# Patient Record
Sex: Female | Born: 1960 | Race: White | Hispanic: No | Marital: Married | Smoking: Never smoker
Health system: Southern US, Community
[De-identification: ages and names within clinical notes are randomized; demographics above are authoritative.]

## PROBLEM LIST (undated history)

## (undated) DIAGNOSIS — N951 Menopausal and female climacteric states: Secondary | ICD-10-CM

## (undated) DIAGNOSIS — M8580 Other specified disorders of bone density and structure, unspecified site: Secondary | ICD-10-CM

## (undated) DIAGNOSIS — R42 Dizziness and giddiness: Secondary | ICD-10-CM

## (undated) DIAGNOSIS — M810 Age-related osteoporosis without current pathological fracture: Secondary | ICD-10-CM

## (undated) DIAGNOSIS — K219 Gastro-esophageal reflux disease without esophagitis: Secondary | ICD-10-CM

## (undated) DIAGNOSIS — E7800 Pure hypercholesterolemia, unspecified: Secondary | ICD-10-CM

## (undated) DIAGNOSIS — C801 Malignant (primary) neoplasm, unspecified: Secondary | ICD-10-CM

## (undated) DIAGNOSIS — L719 Rosacea, unspecified: Secondary | ICD-10-CM

## (undated) DIAGNOSIS — M8008XA Age-related osteoporosis with current pathological fracture, vertebra(e), initial encounter for fracture: Secondary | ICD-10-CM

## (undated) DIAGNOSIS — G4750 Parasomnia, unspecified: Secondary | ICD-10-CM

## (undated) DIAGNOSIS — R7303 Prediabetes: Secondary | ICD-10-CM

## (undated) DIAGNOSIS — E559 Vitamin D deficiency, unspecified: Secondary | ICD-10-CM

## (undated) DIAGNOSIS — I77819 Aortic ectasia, unspecified site: Secondary | ICD-10-CM

## (undated) HISTORY — DX: Gastro-esophageal reflux disease without esophagitis: K21.9

## (undated) HISTORY — DX: Dizziness and giddiness: R42

## (undated) HISTORY — DX: Vitamin D deficiency, unspecified: E55.9

## (undated) HISTORY — DX: Parasomnia, unspecified: G47.50

## (undated) HISTORY — PX: TONSILLECTOMY: SUR1361

## (undated) HISTORY — DX: Age-related osteoporosis with current pathological fracture, vertebra(e), initial encounter for fracture: M80.08XA

## (undated) HISTORY — DX: Rosacea, unspecified: L71.9

## (undated) HISTORY — DX: Menopausal and female climacteric states: N95.1

## (undated) HISTORY — DX: Malignant (primary) neoplasm, unspecified: C80.1

## (undated) HISTORY — PX: HEMORRHOID SURGERY: SHX153

## (undated) HISTORY — DX: Pure hypercholesterolemia, unspecified: E78.00

## (undated) HISTORY — PX: RHINOPLASTY: SUR1284

## (undated) HISTORY — DX: Prediabetes: R73.03

## (undated) HISTORY — DX: Aortic ectasia, unspecified site: I77.819

## (undated) HISTORY — DX: Age-related osteoporosis without current pathological fracture: M81.0

## (undated) HISTORY — PX: KNEE ARTHROSCOPY: SUR90

## (undated) HISTORY — DX: Other specified disorders of bone density and structure, unspecified site: M85.80

---

## 1997-08-17 ENCOUNTER — Other Ambulatory Visit: Admission: RE | Admit: 1997-08-17 | Discharge: 1997-08-17 | Payer: Self-pay | Admitting: Obstetrics and Gynecology

## 1997-10-09 ENCOUNTER — Other Ambulatory Visit: Admission: RE | Admit: 1997-10-09 | Discharge: 1997-10-09 | Payer: Self-pay | Admitting: Obstetrics and Gynecology

## 1997-10-22 ENCOUNTER — Inpatient Hospital Stay (HOSPITAL_COMMUNITY): Admission: AD | Admit: 1997-10-22 | Discharge: 1997-10-22 | Payer: Self-pay | Admitting: Obstetrics and Gynecology

## 1997-10-25 ENCOUNTER — Inpatient Hospital Stay (HOSPITAL_COMMUNITY): Admission: AD | Admit: 1997-10-25 | Discharge: 1997-10-25 | Payer: Self-pay | Admitting: Obstetrics and Gynecology

## 1997-11-01 ENCOUNTER — Inpatient Hospital Stay (HOSPITAL_COMMUNITY): Admission: AD | Admit: 1997-11-01 | Discharge: 1997-11-01 | Payer: Self-pay | Admitting: Obstetrics and Gynecology

## 1998-01-08 ENCOUNTER — Inpatient Hospital Stay (HOSPITAL_COMMUNITY): Admission: AD | Admit: 1998-01-08 | Discharge: 1998-01-08 | Payer: Self-pay | Admitting: Obstetrics and Gynecology

## 1998-01-11 ENCOUNTER — Inpatient Hospital Stay (HOSPITAL_COMMUNITY): Admission: AD | Admit: 1998-01-11 | Discharge: 1998-01-11 | Payer: Self-pay | Admitting: Obstetrics and Gynecology

## 1998-01-18 ENCOUNTER — Inpatient Hospital Stay (HOSPITAL_COMMUNITY): Admission: AD | Admit: 1998-01-18 | Discharge: 1998-01-18 | Payer: Self-pay | Admitting: Obstetrics and Gynecology

## 1998-01-28 ENCOUNTER — Ambulatory Visit (HOSPITAL_COMMUNITY): Admission: RE | Admit: 1998-01-28 | Discharge: 1998-01-28 | Payer: Self-pay | Admitting: Gynecology

## 1998-03-16 ENCOUNTER — Ambulatory Visit (HOSPITAL_COMMUNITY): Admission: RE | Admit: 1998-03-16 | Discharge: 1998-03-16 | Payer: Self-pay | Admitting: Gastroenterology

## 1998-05-05 ENCOUNTER — Inpatient Hospital Stay (HOSPITAL_COMMUNITY): Admission: AD | Admit: 1998-05-05 | Discharge: 1998-05-05 | Payer: Self-pay | Admitting: Gynecology

## 1998-05-06 ENCOUNTER — Inpatient Hospital Stay (HOSPITAL_COMMUNITY): Admission: AD | Admit: 1998-05-06 | Discharge: 1998-05-06 | Payer: Self-pay | Admitting: Obstetrics and Gynecology

## 1998-05-07 ENCOUNTER — Inpatient Hospital Stay (HOSPITAL_COMMUNITY): Admission: AD | Admit: 1998-05-07 | Discharge: 1998-05-07 | Payer: Self-pay | Admitting: Obstetrics and Gynecology

## 1998-05-08 ENCOUNTER — Inpatient Hospital Stay (HOSPITAL_COMMUNITY): Admission: AD | Admit: 1998-05-08 | Discharge: 1998-05-08 | Payer: Self-pay | Admitting: Obstetrics and Gynecology

## 1998-05-09 ENCOUNTER — Inpatient Hospital Stay (HOSPITAL_COMMUNITY): Admission: AD | Admit: 1998-05-09 | Discharge: 1998-05-09 | Payer: Self-pay | Admitting: Obstetrics and Gynecology

## 1998-05-10 ENCOUNTER — Ambulatory Visit (HOSPITAL_COMMUNITY): Admission: RE | Admit: 1998-05-10 | Discharge: 1998-05-10 | Payer: Self-pay | Admitting: Obstetrics and Gynecology

## 1998-09-18 ENCOUNTER — Other Ambulatory Visit: Admission: RE | Admit: 1998-09-18 | Discharge: 1998-09-18 | Payer: Self-pay | Admitting: Obstetrics and Gynecology

## 1998-10-13 ENCOUNTER — Inpatient Hospital Stay (HOSPITAL_COMMUNITY): Admission: AD | Admit: 1998-10-13 | Discharge: 1998-10-13 | Payer: Self-pay | Admitting: Gynecology

## 1998-11-17 ENCOUNTER — Inpatient Hospital Stay (HOSPITAL_COMMUNITY): Admission: AD | Admit: 1998-11-17 | Discharge: 1998-11-17 | Payer: Self-pay | Admitting: Gynecology

## 1998-11-24 ENCOUNTER — Inpatient Hospital Stay (HOSPITAL_COMMUNITY): Admission: AD | Admit: 1998-11-24 | Discharge: 1998-11-24 | Payer: Self-pay | Admitting: Obstetrics and Gynecology

## 1999-11-07 ENCOUNTER — Other Ambulatory Visit: Admission: RE | Admit: 1999-11-07 | Discharge: 1999-11-07 | Payer: Self-pay | Admitting: Obstetrics and Gynecology

## 2000-01-03 ENCOUNTER — Ambulatory Visit (HOSPITAL_COMMUNITY): Admission: AD | Admit: 2000-01-03 | Discharge: 2000-01-03 | Payer: Self-pay | Admitting: Obstetrics and Gynecology

## 2000-11-19 ENCOUNTER — Other Ambulatory Visit: Admission: RE | Admit: 2000-11-19 | Discharge: 2000-11-19 | Payer: Self-pay | Admitting: Obstetrics and Gynecology

## 2001-11-23 ENCOUNTER — Other Ambulatory Visit: Admission: RE | Admit: 2001-11-23 | Discharge: 2001-11-23 | Payer: Self-pay | Admitting: Obstetrics and Gynecology

## 2002-04-28 HISTORY — PX: DILATION AND CURETTAGE OF UTERUS: SHX78

## 2002-07-19 ENCOUNTER — Ambulatory Visit (HOSPITAL_COMMUNITY): Admission: RE | Admit: 2002-07-19 | Discharge: 2002-07-19 | Payer: Self-pay | Admitting: Orthopedic Surgery

## 2002-07-19 ENCOUNTER — Ambulatory Visit (HOSPITAL_COMMUNITY): Admission: RE | Admit: 2002-07-19 | Discharge: 2002-07-19 | Payer: Self-pay | Admitting: Neurology

## 2002-07-20 ENCOUNTER — Encounter: Payer: Self-pay | Admitting: Neurology

## 2002-07-20 ENCOUNTER — Encounter: Payer: Self-pay | Admitting: Orthopedic Surgery

## 2002-12-29 ENCOUNTER — Encounter (INDEPENDENT_AMBULATORY_CARE_PROVIDER_SITE_OTHER): Payer: Self-pay | Admitting: Specialist

## 2002-12-29 ENCOUNTER — Ambulatory Visit (HOSPITAL_BASED_OUTPATIENT_CLINIC_OR_DEPARTMENT_OTHER): Admission: RE | Admit: 2002-12-29 | Discharge: 2002-12-29 | Payer: Self-pay | Admitting: Obstetrics and Gynecology

## 2003-02-01 ENCOUNTER — Other Ambulatory Visit: Admission: RE | Admit: 2003-02-01 | Discharge: 2003-02-01 | Payer: Self-pay | Admitting: Obstetrics and Gynecology

## 2003-10-06 ENCOUNTER — Ambulatory Visit (HOSPITAL_COMMUNITY): Admission: RE | Admit: 2003-10-06 | Discharge: 2003-10-06 | Payer: Self-pay | Admitting: *Deleted

## 2003-10-06 ENCOUNTER — Encounter (INDEPENDENT_AMBULATORY_CARE_PROVIDER_SITE_OTHER): Payer: Self-pay | Admitting: Specialist

## 2004-02-05 ENCOUNTER — Other Ambulatory Visit: Admission: RE | Admit: 2004-02-05 | Discharge: 2004-02-05 | Payer: Self-pay | Admitting: Obstetrics and Gynecology

## 2005-01-02 ENCOUNTER — Encounter: Admission: RE | Admit: 2005-01-02 | Discharge: 2005-01-02 | Payer: Self-pay | Admitting: Family Medicine

## 2005-02-10 ENCOUNTER — Other Ambulatory Visit: Admission: RE | Admit: 2005-02-10 | Discharge: 2005-02-10 | Payer: Self-pay | Admitting: Obstetrics and Gynecology

## 2006-02-13 ENCOUNTER — Other Ambulatory Visit: Admission: RE | Admit: 2006-02-13 | Discharge: 2006-02-13 | Payer: Self-pay | Admitting: Obstetrics and Gynecology

## 2007-02-24 ENCOUNTER — Other Ambulatory Visit: Admission: RE | Admit: 2007-02-24 | Discharge: 2007-02-24 | Payer: Self-pay | Admitting: Obstetrics and Gynecology

## 2007-06-10 ENCOUNTER — Ambulatory Visit (HOSPITAL_COMMUNITY): Admission: RE | Admit: 2007-06-10 | Discharge: 2007-06-10 | Payer: Self-pay | Admitting: Orthopedic Surgery

## 2008-03-28 ENCOUNTER — Other Ambulatory Visit: Admission: RE | Admit: 2008-03-28 | Discharge: 2008-03-28 | Payer: Self-pay | Admitting: Obstetrics and Gynecology

## 2008-03-28 ENCOUNTER — Encounter: Payer: Self-pay | Admitting: Obstetrics and Gynecology

## 2008-03-28 ENCOUNTER — Ambulatory Visit: Payer: Self-pay | Admitting: Obstetrics and Gynecology

## 2008-07-14 ENCOUNTER — Ambulatory Visit: Payer: Self-pay | Admitting: Obstetrics and Gynecology

## 2009-02-28 ENCOUNTER — Ambulatory Visit: Payer: Self-pay | Admitting: Obstetrics and Gynecology

## 2009-03-27 ENCOUNTER — Ambulatory Visit: Payer: Self-pay | Admitting: Obstetrics and Gynecology

## 2009-03-29 ENCOUNTER — Other Ambulatory Visit: Admission: RE | Admit: 2009-03-29 | Discharge: 2009-03-29 | Payer: Self-pay | Admitting: Addiction Medicine

## 2009-03-29 ENCOUNTER — Ambulatory Visit: Payer: Self-pay | Admitting: Obstetrics and Gynecology

## 2010-05-14 ENCOUNTER — Other Ambulatory Visit: Payer: Self-pay | Admitting: Obstetrics and Gynecology

## 2010-05-14 ENCOUNTER — Ambulatory Visit
Admission: RE | Admit: 2010-05-14 | Discharge: 2010-05-14 | Payer: Self-pay | Source: Home / Self Care | Attending: Obstetrics and Gynecology | Admitting: Obstetrics and Gynecology

## 2010-05-14 ENCOUNTER — Other Ambulatory Visit
Admission: RE | Admit: 2010-05-14 | Discharge: 2010-05-14 | Payer: Self-pay | Source: Home / Self Care | Admitting: Obstetrics and Gynecology

## 2010-09-13 NOTE — Op Note (Signed)
   NAME:  Sarah Mclean, Sarah Mclean                          ACCOUNT NO.:  [id number]   MEDICAL RECORD NO.:  [id number]                   PATIENT TYPE:  AMB   LOCATION:  [facility]                                 FACILITY:  [facility]   PHYSICIAN:  Daniel L. Eda Paschal, M.D.           DATE OF BIRTH:  [birth year]   DATE OF PROCEDURE:  12/29/2002  DATE OF DISCHARGE:                                 OPERATIVE REPORT   PREOPERATIVE DIAGNOSIS:  Missed abortion.   POSTOPERATIVE DIAGNOSIS:  Missed abortion.   OPERATION:  Suction curettage.   SURGEON:  Daniel L. Eda Paschal, M.D.   ANESTHESIA:  General.   INDICATIONS:  The patient is a 50 year old female, who had her last normal  menstrual period 2-1/2 weeks ago.  She then started to have heavy bleeding  this week.  Quantitative hCG was elevated at 17,000 in spite of not missing  a period.  Ultrasound was consistent with a collapsed sac which was  consistent with a missed AB.  She is still bleeding.  She now enters the  hospital for [facility] for above.   FINDINGS:  External and vaginal was within normal limits.  Cervix is clean.  Uterus is top-normal size and shape.  Cervix was closed.  Adnexa are  palpably normal.   DESCRIPTION OF PROCEDURE:  After adequate general anesthesia, the patient  was placed in the dorsal lithotomy position, prepped and draped in the usual  sterile manner.  A single-tooth tenaculum was placed in the anterior lip of  the cervix.  The cervix was dilated to a #25 Pratt dilator, and then a  suction curettage was done with a #9 vacuum curette.  Tissue was consistent  with a missed AB.  At the termination of the procedure, there was no  bleeding noted.  The patient tolerated the procedure well and left the  operating room in satisfactory condition.                                               Daniel L. Eda Paschal, M.D.    Tonette Bihari  D:  12/29/2002  T:  12/29/2002  Job:  [id number]

## 2010-09-13 NOTE — H&P (Signed)
[facility]  Patient:    Sarah Mclean, Sarah Mclean                       MRN: [id number] Adm. Date:  12/10/99 Attending:  Gaetano Hawthorne. Lily Peer, M.D.                         History and Physical  CHIEF COMPLAINT:              Incomplete abortion.  HISTORY OF PRESENT ILLNESS:   The patient is a 50 year old gravida 5, para 2, who reported last menstrual period was June 24, and today, she would be approximately 7-1/[redacted] weeks gestation with a due date of March 30.  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 On July 23, her quantitative beta hCG was 67 international units ____, July 26 was 81, July 27 was 96, August 2 was 688.  Her last quantitative beta hCG was 2482 on August 7. DD:  12/09/99 TD:  12/09/99 Job: [id number]

## 2010-09-13 NOTE — Op Note (Signed)
[facility]  Patient:    Sarah Mclean, Sarah Mclean                       MRN: [id number] Proc. Date: 01/03/00 Adm. Date:  [id number] Disc. Date: [id number] Attending:  Sharon Mt                           Operative Report  PREOPERATIVE DIAGNOSIS:  Missed abortion.  POSTOPERATIVE DIAGNOSIS:  Missed abortion.  OPERATION:  Suction curettage.  SURGEON:  Daniel L. Eda Paschal, M.D.  ANESTHESIA:  Paracervical block plus IV sedation.  INDICATIONS:  The patient is a 50 year old gravida 7, para 2, ab 4, who has been followed in my office with an early IUP.  Initially she was followed with serial HCGs and then over the last two to three weeks with serial ultrasounds. They have not shown normal progression, and they have been consistent with a missed ab.  She was offered suction curettage for missed ab but was hopeful to have this proceed in a natural way, and therefore it was done in spite of the diagnosis being made.  However, this morning she came in very heavy vaginal bleeding and cramping and now is ready for intervention.  Ultrasound was redone today to be sure she had not passed all of the tissue since her ultrasound Tuesday confirming a missed ab, and there was still tissue present in the uterus.  FINDINGS:  External and vaginal is within normal limits.  Cervix is clean. Uterus is normal size and shape.  Adnexa are palpably normal.  At the time of suction curettage, there was some significant tissue in the uterus.  The cervix was actually not dilated and had to be dilated.  DESCRIPTION OF PROCEDURE:  The patient was taken to the treatment room, placed in the dorsal lithotomy position and prepped and draped in the usual sterile manner.  She was given IV Versed and Dilaudid.  She was monitored.  She was given a 20 cc 1% plain paracervical block.  Her cervix was dilated to a #25 Pratt dilator, and then a suction curettage was done with removal of  the tissue.  The uterus contracted well.  There was no significant bleeding.  The patient left the treatment room in satisfactory condition. DD:  01/03/00 TD:  01/05/00 Job: [id number]

## 2010-09-13 NOTE — Op Note (Signed)
NAME:  Sarah Mclean, Sarah Mclean NO.:  [id number]   MEDICAL RECORD NO.:  [id number]                   PATIENT TYPE:  AMB   LOCATION:  DAY                                  FACILITY:  [facility]   PHYSICIAN:  Vikki Ports, M.D.         DATE OF BIRTH:  November 06, 1960   DATE OF PROCEDURE:  10/06/2003  DATE OF DISCHARGE:                                 OPERATIVE REPORT   PREOPERATIVE DIAGNOSIS:  Grade III internal hemorrhoids with prolapse.   POSTOPERATIVE DIAGNOSIS:  Grade III internal hemorrhoids with prolapse.   PROCEDURE:  PPH hemorrhoidopexy.   SURGEON:  Vikki Ports, M.D.   ANESTHESIA:  General.   DESCRIPTION OF PROCEDURE:  The patient was taken to the operating room and  placed in the supine position.  After adequate general anesthesia was  induced, the patient was placed in the prone jackknife position.  Perianal  and rectal prep were undertaken.  I first started by injecting 0.5% Marcaine  with 1 mL of Wydase into the three hemorrhoidal bundles.  Anal dilatation  was accomplished up to three digits.  I then placed a 2-0 Prolene suture 5  cm proximal to the dentate line, picking up the mucosa and submucosa only.  This was done circumferentially.  At the completion of this, I injected an  additional 40 mL of Marcaine in the internal and external sphincter muscles.  The stapler was then placed within the rectum.  Pursestring suture was tied  down around it.  The stapler was closed, held for 30 seconds and then fired.  It was removed.  Staple line was intact and was hemostatic.  Inspection of  the hemorrhoidal bundle showed a 2 cm equal ring of hemorrhoidal tissue.  I  see no evidence of muscularis.  The vagina was checked prior to firing the  stapler as well.  Gelfoam packing was placed in the rectum.  The patient  tolerated the procedure well and went to PACU in good condition.                                               Vikki Ports,  M.D.    KRH/MEDQ  D:  10/06/2003  T:  10/06/2003  Job:  [id number]

## 2011-05-15 ENCOUNTER — Encounter: Payer: Self-pay | Admitting: Gynecology

## 2011-05-15 DIAGNOSIS — L719 Rosacea, unspecified: Secondary | ICD-10-CM | POA: Insufficient documentation

## 2011-05-15 DIAGNOSIS — M8580 Other specified disorders of bone density and structure, unspecified site: Secondary | ICD-10-CM | POA: Insufficient documentation

## 2011-05-21 ENCOUNTER — Other Ambulatory Visit: Payer: Self-pay | Admitting: Obstetrics and Gynecology

## 2011-05-21 ENCOUNTER — Encounter (INDEPENDENT_AMBULATORY_CARE_PROVIDER_SITE_OTHER): Payer: BC Managed Care – PPO

## 2011-05-21 DIAGNOSIS — M949 Disorder of cartilage, unspecified: Secondary | ICD-10-CM

## 2011-05-21 DIAGNOSIS — M8580 Other specified disorders of bone density and structure, unspecified site: Secondary | ICD-10-CM

## 2011-06-12 ENCOUNTER — Other Ambulatory Visit (HOSPITAL_COMMUNITY)
Admission: RE | Admit: 2011-06-12 | Discharge: 2011-06-12 | Disposition: A | Discharge status: Home / Self Care | Payer: BC Managed Care – PPO | Source: Ambulatory Visit | Attending: Obstetrics and Gynecology | Admitting: Obstetrics and Gynecology

## 2011-06-12 ENCOUNTER — Encounter: Payer: Self-pay | Admitting: Obstetrics and Gynecology

## 2011-06-12 ENCOUNTER — Ambulatory Visit (INDEPENDENT_AMBULATORY_CARE_PROVIDER_SITE_OTHER): Payer: BC Managed Care – PPO | Admitting: Obstetrics and Gynecology

## 2011-06-12 VITALS — BP 120/64 | Ht 64.0 in | Wt 116.0 lb

## 2011-06-12 DIAGNOSIS — N9089 Other specified noninflammatory disorders of vulva and perineum: Secondary | ICD-10-CM

## 2011-06-12 DIAGNOSIS — Z01419 Encounter for gynecological examination (general) (routine) without abnormal findings: Secondary | ICD-10-CM | POA: Insufficient documentation

## 2011-06-12 DIAGNOSIS — Z833 Family history of diabetes mellitus: Secondary | ICD-10-CM

## 2011-06-12 LAB — CBC WITH DIFFERENTIAL/PLATELET
Basophils Relative: 0 % (ref 0–1)
Eosinophils Absolute: 0.1 10*3/uL (ref 0.0–0.7)
Eosinophils Relative: 1 % (ref 0–5)
HCT: 36.2 % (ref 36.0–46.0)
Hemoglobin: 11.5 g/dL — ABNORMAL LOW (ref 12.0–15.0)
MCH: 28.9 pg (ref 26.0–34.0)
MCHC: 31.8 g/dL (ref 30.0–36.0)
Monocytes Absolute: 0.5 10*3/uL (ref 0.1–1.0)
Monocytes Relative: 10 % (ref 3–12)

## 2011-06-12 LAB — URINALYSIS W MICROSCOPIC + REFLEX CULTURE
Bilirubin Urine: NEGATIVE
Crystals: NONE SEEN
Glucose, UA: NEGATIVE mg/dL
Ketones, ur: NEGATIVE mg/dL
Protein, ur: NEGATIVE mg/dL
RBC / HPF: NONE SEEN RBC/hpf (ref <3)
Specific Gravity, Urine: >1.03 — ABNORMAL HIGH (ref 1.005–1.030)
Squamous Epithelial / LPF: NONE SEEN
WBC, UA: NONE SEEN WBC/hpf (ref <3)

## 2011-06-12 NOTE — Progress Notes (Signed)
Patient came to see me today for her annual GYN exam. She is doing well without any GYN problems. She does not take HRT. She is having no vaginal bleeding. She is having no pelvic pain. She is up-to-date on mammograms. Her last bone density showed worsening osteopenia without an elevated FRAX risk. She takes calcium and vitamin D. She has had no fractures.  Physical examination: Kennon Portela present HEENT within normal limits. Neck: Thyroid not large. No masses. Supraclavicular nodes: not enlarged. Breasts: Examined in both sitting midline position. No skin changes and no masses. Abdomen: Soft no guarding rebound or masses or hernia. Pelvic: External: Within normal limits except on left labia there is a 2 mm raised vascular lesion. BUS: Within normal limits. Vaginal:within normal limits. Good estrogen effect. No evidence of cystocele rectocele or enterocele. Cervix: clean. Uterus: Normal size and shape. Adnexa: No masses. Rectovaginal exam: Confirmatory and negative. Extremities: Within normal limits.  Assessment: Osteopenia. Vulvar lesion.  Plan: Continue yearly mammograms. Continue periodic bone densities. We need to remove this lesion on her vulva. The area was prepped and draped in a sterile manner with Betadine. One percent Xylocaine was used for anesthetic. The lesion was removed with a scalpel. It was sent to pathology for tissue diagnosis. The patient was treated with Monsel solution. She was instructed on postoperative care.

## 2011-09-04 ENCOUNTER — Encounter: Payer: Self-pay | Admitting: Obstetrics and Gynecology

## 2011-09-25 ENCOUNTER — Other Ambulatory Visit: Payer: Self-pay | Admitting: *Deleted

## 2011-09-25 MED ORDER — NONFORMULARY OR COMPOUNDED ITEM: Status: DC

## 2011-09-25 NOTE — Telephone Encounter (Signed)
rx called in

## 2012-06-14 ENCOUNTER — Ambulatory Visit (INDEPENDENT_AMBULATORY_CARE_PROVIDER_SITE_OTHER): Payer: BC Managed Care – PPO | Admitting: Gynecology

## 2012-06-14 ENCOUNTER — Encounter: Payer: Self-pay | Admitting: Gynecology

## 2012-06-14 VITALS — BP 100/68 | Ht 64.0 in | Wt 120.0 lb

## 2012-06-14 DIAGNOSIS — IMO0002 Reserved for concepts with insufficient information to code with codable children: Secondary | ICD-10-CM | POA: Insufficient documentation

## 2012-06-14 DIAGNOSIS — Z803 Family history of malignant neoplasm of breast: Secondary | ICD-10-CM | POA: Insufficient documentation

## 2012-06-14 DIAGNOSIS — Z800 Family history of malignant neoplasm of digestive organs: Secondary | ICD-10-CM | POA: Insufficient documentation

## 2012-06-14 DIAGNOSIS — M8580 Other specified disorders of bone density and structure, unspecified site: Secondary | ICD-10-CM

## 2012-06-14 DIAGNOSIS — N951 Menopausal and female climacteric states: Secondary | ICD-10-CM

## 2012-06-14 DIAGNOSIS — M949 Disorder of cartilage, unspecified: Secondary | ICD-10-CM

## 2012-06-14 DIAGNOSIS — Z8639 Personal history of other endocrine, nutritional and metabolic disease: Secondary | ICD-10-CM | POA: Insufficient documentation

## 2012-06-14 DIAGNOSIS — Z01419 Encounter for gynecological examination (general) (routine) without abnormal findings: Secondary | ICD-10-CM

## 2012-06-14 LAB — CBC WITH DIFFERENTIAL/PLATELET
Basophils Absolute: 0 10*3/uL (ref 0.0–0.1)
Eosinophils Absolute: 0.1 10*3/uL (ref 0.0–0.7)
Eosinophils Relative: 1 % (ref 0–5)
Lymphocytes Relative: 32 % (ref 12–46)
MCV: 87.5 fL (ref 78.0–100.0)
Neutrophils Relative %: 57 % (ref 43–77)
Platelets: 208 10*3/uL (ref 150–400)
RDW: 13.1 % (ref 11.5–15.5)
WBC: 5.5 10*3/uL (ref 4.0–10.5)

## 2012-06-14 LAB — COMPREHENSIVE METABOLIC PANEL
ALT: 14 U/L (ref 0–35)
AST: 19 U/L (ref 0–37)
Chloride: 107 mEq/L (ref 96–112)
Creat: 0.71 mg/dL (ref 0.50–1.10)
Sodium: 137 mEq/L (ref 135–145)
Total Bilirubin: 1.1 mg/dL (ref 0.3–1.2)
Total Protein: 6.5 g/dL (ref 6.0–8.3)

## 2012-06-14 MED ORDER — NONFORMULARY OR COMPOUNDED ITEM: Status: DC

## 2012-06-14 NOTE — Progress Notes (Addendum)
Sarah Mclean 06/14/1960 [id number]   History:    52 y.o.  for annual gyn exam complaining of vaginal dryness and dyspareunia along with occasional vasomotor symptoms. Patient did have an FSH in 2010 which was found to be elevated with a value of 114.My partner in the past had offered her hormone replacement therapy but patient was adamant. Review of her record indicated that in January 2013 her bone density study had demonstrated that she was in the osteopenic range but had significant decreased bone mineralization in the AP spine and left hip. Patient is taking calcium and vitamin D daily and exercising regularly.patient's had 2 prior colonoscopy the last one was for years ago. She has one sister who had breast cancer and was tested for the BRCA1 / BRCA2 gene and was negative. Another sister had colon cancer the age of 12. Review of patient's record also indicated that she has had past history vitamin D deficiency. Patient with no prior history of abnormal Pap smears. Her flu vaccine is up-to-date. She is not sure she's had Tdap vaccine in the past. She has not seen her primary physician for several years.  Past medical history,surgical history, family history and social history were all reviewed and documented in the EPIC chart.  Gynecologic History No LMP recorded. Patient is postmenopausal. Contraception: post menopausal status Last Pap: 2013. Results were: normal Last mammogram: 2013. Results were: normal  Obstetric History OB History   Grav Para Term Preterm Abortions TAB SAB Ect Mult Living   6 2 2  4  4   2      # Outc Date GA Lbr Len/2nd Wgt Sex Del Anes PTL Lv   1 TRM            2 TRM            3 SAB            4 SAB            5 SAB            6 SAB                ROS: A ROS was performed and pertinent positives and negatives are included in the history.  GENERAL: No fevers or chills. HEENT: No change in vision, no earache, sore throat or sinus congestion. NECK: No pain or  stiffness. CARDIOVASCULAR: No chest pain or pressure. No palpitations. PULMONARY: No shortness of breath, cough or wheeze. GASTROINTESTINAL: No abdominal pain, nausea, vomiting or diarrhea, melena or bright red blood per rectum. GENITOURINARY: No urinary frequency, urgency, hesitancy or dysuria. MUSCULOSKELETAL: No joint or muscle pain, no back pain, no recent trauma. DERMATOLOGIC: No rash, no itching, no lesions. ENDOCRINE: No polyuria, polydipsia, no heat or cold intolerance. No recent change in weight. HEMATOLOGICAL: No anemia or easy bruising or bleeding. NEUROLOGIC: No headache, seizures, numbness, tingling or weakness. PSYCHIATRIC: No depression, no loss of interest in normal activity or change in sleep pattern.     Exam: chaperone present  BP 100/68  Ht 5\' 4"  (1.626 m)  Wt 120 lb (54.432 kg)  BMI 20.59 kg/m2  Body mass index is 20.59 kg/(m^2).  General appearance : Well developed well nourished female. No acute distress HEENT: Neck supple, trachea midline, no carotid bruits, no thyroidmegaly Lungs: Clear to auscultation, no rhonchi or wheezes, or rib retractions  Heart: Regular rate and rhythm, no murmurs or gallops Breast:Examined in sitting and supine position were symmetrical in appearance, no palpable  masses or tenderness,  no skin retraction, no nipple inversion, no nipple discharge, no skin discoloration, no axillary or supraclavicular lymphadenopathy Abdomen: no palpable masses or tenderness, no rebound or guarding Extremities: no edema or skin discoloration or tenderness  Pelvic:  Bartholin, Urethra, Skene Glands: Within normal limits             Vagina: No gross lesions or discharge, atrophic changes  Cervix: No gross lesions or discharge  Uterus  anteverted, normal size, shape and consistency, non-tender and mobile  Adnexa  Without masses or tenderness  Anus and perineum  normal   Rectovaginal  normal sphincter tone without palpated masses or tenderness              Hemoccult not done     Assessment/Plan:  52 y.o. female for annual exam who has for many years has been  menopausal. Her vaginal atrophy is  contributing to her dyspareunia and irritation. We had a lengthy discussion of the women's health initiative study as well as different routes of administration of hormone replacement therapy. We also discussed Osphena its risks benefits pros and cons as well. I have explained to her that she would benefit from the low dose vaginal estrogen such as estradiol 0.02% to apply intravaginally twice a week. This would not only help with her vaginal dryness and dyspareunia but the absorption may help with her minimal vasomotor symptoms as well as to help with her osteopenia. She is fully aware that there is still a small risk of breast cancer. We did discuss importance of her doing her monthly self breast examination. She fully understands and accepts and would like to start with the estradiol vaginal estrogen. No Pap smear was done today and the new screening guidelines were discussed. The following labs were ordered: Comprehensive metabolic panel, CBC, urinalysis, TSH, screening cholesterol, urinalysis and vitamin D. She was reminded of the importance of monthly self breast examination and to schedule her mammogram. Literature formation on hormone replacement therapy was provided as well. She will check with her primary physician if her Tdap vaccine is up-to-date.    Ok Edwards MD, 3:38 PM 06/14/2012

## 2012-06-14 NOTE — Patient Instructions (Addendum)
Tetanus, Diphtheria, Pertussis (Tdap) Vaccine What You Need to Know WHY GET VACCINATED? Tetanus, diphtheria and pertussis can be very serious diseases, even for adolescents and adults. Tdap vaccine can protect us from these diseases. TETANUS (Lockjaw) causes painful muscle tightening and stiffness, usually all over the body.  It can lead to tightening of muscles in the head and neck so you can't open your mouth, swallow, or sometimes even breathe. Tetanus kills about 1 out of 5 people who are infected. DIPHTHERIA can cause a thick coating to form in the back of the throat.  It can lead to breathing problems, paralysis, heart failure, and death. PERTUSSIS (Whooping Cough) causes severe coughing spells, which can cause difficulty breathing, vomiting and disturbed sleep.  It can also lead to weight loss, incontinence, and rib fractures. Up to 2 in 100 adolescents and 5 in 100 adults with pertussis are hospitalized or have complications, which could include pneumonia and death. These diseases are caused by bacteria. Diphtheria and pertussis are spread from person to person through coughing or sneezing. Tetanus enters the body through cuts, scratches, or wounds. Before vaccines, the [place] saw as many as 200,000 cases a year of diphtheria and pertussis, and hundreds of cases of tetanus. Since vaccination began, tetanus and diphtheria have dropped by about 99% and pertussis by about 80%. TDAP VACCINE Tdap vaccine can protect adolescents and adults from tetanus, diphtheria, and pertussis. One dose of Tdap is routinely given at age 11 or 12. People who did not get Tdap at that age should get it as soon as possible. Tdap is especially important for health care professionals and anyone having close contact with a baby younger than 12 months. Pregnant women should get a dose of Tdap during every pregnancy, to protect the newborn from pertussis. Infants are most at risk for severe, life-threatening  complications from pertussis. A similar vaccine, called Td, protects from tetanus and diphtheria, but not pertussis. A Td booster should be given every 10 years. Tdap may be given as one of these boosters if you have not already gotten a dose. Tdap may also be given after a severe cut or burn to prevent tetanus infection. Your doctor can give you more information. Tdap may safely be given at the same time as other vaccines. SOME PEOPLE SHOULD NOT GET THIS VACCINE  If you ever had a life-threatening allergic reaction after a dose of any tetanus, diphtheria, or pertussis containing vaccine, OR if you have a severe allergy to any part of this vaccine, you should not get Tdap. Tell your doctor if you have any severe allergies.  If you had a coma, or long or multiple seizures within 7 days after a childhood dose of DTP or DTaP, you should not get Tdap, unless a cause other than the vaccine was found. You can still get Td.  Talk to your doctor if you:  have epilepsy or another nervous system problem,  had severe pain or swelling after any vaccine containing diphtheria, tetanus or pertussis,  ever had Guillain-Barr Syndrome (GBS),  aren't feeling well on the day the shot is scheduled. RISKS OF A VACCINE REACTION With any medicine, including vaccines, there is a chance of side effects. These are usually mild and go away on their own, but serious reactions are also possible. Brief fainting spells can follow a vaccination, leading to injuries from falling. Sitting or lying down for about 15 minutes can help prevent these. Tell your doctor if you feel dizzy or light-headed, or   have vision changes or ringing in the ears. Mild problems following Tdap (Did not interfere with activities)  Pain where the shot was given (about 3 in 4 adolescents or 2 in 3 adults)  Redness or swelling where the shot was given (about 1 person in 5)  Mild fever of at least 100.12F (up to about 1 in 25 adolescents or 1 in  100 adults)  Headache (about 3 or 4 people in 10)  Tiredness (about 1 person in 3 or 4)  Nausea, vomiting, diarrhea, stomach ache (up to 1 in 4 adolescents or 1 in 10 adults)  Chills, body aches, sore joints, rash, swollen glands (uncommon) Moderate problems following Tdap (Interfered with activities, but did not require medical attention)  Pain where the shot was given (about 1 in 5 adolescents or 1 in 100 adults)  Redness or swelling where the shot was given (up to about 1 in 16 adolescents or 1 in 25 adults)  Fever over 102F (about 1 in 100 adolescents or 1 in 250 adults)  Headache (about 3 in 20 adolescents or 1 in 10 adults)  Nausea, vomiting, diarrhea, stomach ache (up to 1 or 3 people in 100)  Swelling of the entire arm where the shot was given (up to about 3 in 100). Severe problems following Tdap (Unable to perform usual activities, required medical attention)  Swelling, severe pain, bleeding and redness in the arm where the shot was given (rare). A severe allergic reaction could occur after any vaccine (estimated less than 1 in a million doses). WHAT IF THERE IS A SERIOUS REACTION? What should I look for?  Look for anything that concerns you, such as signs of a severe allergic reaction, very high fever, or behavior changes. Signs of a severe allergic reaction can include hives, swelling of the face and throat, difficulty breathing, a fast heartbeat, dizziness, and weakness. These would start a few minutes to a few hours after the vaccination. What should I do?  If you think it is a severe allergic reaction or other emergency that can't wait, call 9-1-1 or get the person to the nearest hospital. Otherwise, call your doctor.  Afterward, the reaction should be reported to the "Vaccine Adverse Event Reporting System" (VAERS). Your doctor might file this report, or you can do it yourself through the VAERS web site at www.vaers.LAgents.no, or by calling 1-619-597-9396. VAERS is  only for reporting reactions. They do not give medical advice.  THE NATIONAL VACCINE INJURY COMPENSATION PROGRAM The National Vaccine Injury Compensation Program (VICP) is a federal program that was created to compensate people who may have been injured by certain vaccines. Persons who believe they may have been injured by a vaccine can learn about the program and about filing a claim by calling 1-747-473-0614 or visiting the VICP website at SpiritualWord.at. HOW CAN I LEARN MORE?  Ask your doctor.  Call your local or state health department.  Contact the Centers for Disease Control and Prevention (CDC):  Call 325-812-7722 or visit CDC's website at PicCapture.uy CDC Tdap Vaccine VIS (09/04/11) Document Released: 10/14/2011 Document Reviewed: 10/14/2011 [facility] Patient Information 2013 [place], [place].   Hormone Therapy At menopause, your body begins making less estrogen and progesterone hormones. This causes the body to stop having menstrual periods. This is because estrogen and progesterone hormones control your periods and menstrual cycle. A lack of estrogen may cause symptoms such as:  Hot flushes (or hot flashes).  Vaginal dryness.  Dry skin.  Loss of sex drive.  Risk of bone loss (osteoporosis). When this happens, you may choose to take hormone therapy to get back the estrogen lost during menopause. When the hormone estrogen is given alone, it is usually referred to as ET (Estrogen Therapy). When the hormone progestin is combined with estrogen, it is generally called HT (Hormone Therapy). This was formerly known as hormone replacement therapy (HRT). Your caregiver can help you make a decision on what will be best for you. The decision to use HT seems to change often as new studies are done. Many studies do not agree on the benefits of hormone replacement therapy. LIKELY BENEFITS OF HT INCLUDE PROTECTION FROM:  Hot Flushes (also called hot flashes) - A  hot flush is a sudden feeling of heat that spreads over the face and body. The skin may redden like a blush. It is connected with sweats and sleep disturbance. Women going through menopause may have hot flushes a few times a month or several times per day depending on the woman.  Osteoporosis (bone loss)- Estrogen helps guard against bone loss. After menopause, a woman's bones slowly lose calcium and become weak and brittle. As a result, bones are more likely to break. The hip, wrist, and spine are affected most often. Hormone therapy can help slow bone loss after menopause. Weight bearing exercise and taking calcium with vitamin D also can help prevent bone loss. There are also medications that your caregiver can prescribe that can help prevent osteoporosis.  Vaginal Dryness - Loss of estrogen causes changes in the vagina. Its lining may become thin and dry. These changes can cause pain and bleeding during sexual intercourse. Dryness can also lead to infections. This can cause burning and itching. (Vaginal estrogen treatment can help relieve pain, itching, and dryness.)  Urinary Tract Infections are more common after menopause because of lack of estrogen. Some women also develop urinary incontinence because of low estrogen levels in the vagina and bladder.  Possible other benefits of estrogen include a positive effect on mood and short-term memory in women. RISKS AND COMPLICATIONS  Using estrogen alone without progesterone causes the lining of the uterus to grow. This increases the risk of lining of the uterus (endometrial) cancer. Your caregiver should give another hormone called progestin if you have a uterus.  Women who take combined (estrogen and progestin) HT appear to have an increased risk of breast cancer. The risk appears to be small, but increases throughout the time that HT is taken.  Combined therapy also makes the breast tissue slightly denser which makes it harder to read mammograms  (breast X-rays).  Combined, estrogen and progesterone therapy can be taken together every day, in which case there may be spotting of blood. HT therapy can be taken cyclically in which case you will have menstrual periods. Cyclically means HT is taken for a set amount of days, then not taken, then this process is repeated.  HT may increase the risk of stroke, heart attack, breast cancer and forming blood clots in your leg.  Transdermal estrogen (estrogen that is absorbed through the skin with a patch or a cream) may have more positive results with:  Cholesterol.  Blood pressure.  Blood clots. Having the following conditions may indicate you should not have HT:  Endometrial cancer.  Liver disease.  Breast cancer.  Heart disease.  History of blood clots.  Stroke. TREATMENT   If you choose to take HT and have a uterus, usually estrogen and progestin are prescribed.  Your caregiver will  help you decide the best way to take the medications.  Possible ways to take estrogen include:  Pills.  Patches.  Gels.  Sprays.  Vaginal estrogen cream, rings and tablets.  It is best to take the lowest dose possible that will help your symptoms and take them for the shortest period of time that you can.  Hormone therapy can help relieve some of the problems (symptoms) that affect women at menopause. Before making a decision about HT, talk to your caregiver about what is best for you. Be well informed and comfortable with your decisions. HOME CARE INSTRUCTIONS   Follow your caregivers advice when taking the medications.  A Pap test is done to screen for cervical cancer.  The first Pap test should be done at age 62.  Between ages 74 and 22, Pap tests are repeated every 2 years.  Beginning at age 29, you are advised to have a Pap test every 3 years as long as your past 3 Pap tests have been normal.  Some women have medical problems that increase the chance of getting cervical  cancer. Talk to your caregiver about these problems. It is especially important to talk to your caregiver if a new problem develops soon after your last Pap test. In these cases, your caregiver may recommend more frequent screening and Pap tests.  The above recommendations are the same for women who have or have not gotten the vaccine for HPV (Human Papillomavirus).  If you had a hysterectomy for a problem that was not a cancer or a condition that could lead to cancer, then you no longer need Pap tests. However, even if you no longer need a Pap test, a regular exam is a good idea to make sure no other problems are starting.   If you are between ages 66 and 64, and you have had normal Pap tests going back 10 years, you no longer need Pap tests. However, even if you no longer need a Pap test, a regular exam is a good idea to make sure no other problems are starting.   If you have had past treatment for cervical cancer or a condition that could lead to cancer, you need Pap tests and screening for cancer for at least 20 years after your treatment.  If Pap tests have been discontinued, risk factors (such as a new sexual partner) need to be re-assessed to determine if screening should be resumed.  Some women may need screenings more often if they are at high risk for cervical cancer.  Get mammograms done as per the advice of your caregiver. SEEK IMMEDIATE MEDICAL CARE IF:  You develop abnormal vaginal bleeding.  You have pain or swelling in your legs, shortness of breath, or chest pain.  You develop dizziness or headaches.  You have lumps or changes in your breasts or armpits.  You have slurred speech.  You develop weakness or numbness of your arms or legs.  You have pain, burning, or bleeding when urinating.  You develop abdominal pain. Document Released: 01/11/2003 Document Revised: 07/07/2011 Document Reviewed: 05/01/2010 [facility] Patient Information 2013 [place], [place].

## 2012-06-15 ENCOUNTER — Telehealth: Payer: Self-pay

## 2012-06-15 ENCOUNTER — Encounter: Payer: Self-pay | Admitting: Gynecology

## 2012-06-15 ENCOUNTER — Other Ambulatory Visit: Payer: Self-pay | Admitting: Gynecology

## 2012-06-15 DIAGNOSIS — R799 Abnormal finding of blood chemistry, unspecified: Secondary | ICD-10-CM

## 2012-06-15 LAB — URINALYSIS W MICROSCOPIC + REFLEX CULTURE
Bilirubin Urine: NEGATIVE
Casts: NONE SEEN
Glucose, UA: NEGATIVE mg/dL
Specific Gravity, Urine: 1.021 (ref 1.005–1.030)
Squamous Epithelial / LPF: NONE SEEN

## 2012-06-15 NOTE — Telephone Encounter (Signed)
Left message home and cell phone to call me regarding test result.

## 2012-06-15 NOTE — Telephone Encounter (Signed)
Patient informed. Instructed to be well hydrated. Order put in system. She will call for lab appt.

## 2012-06-15 NOTE — Telephone Encounter (Signed)
Message copied by Keenan Bachelor on Tue Jun 15, 2012 12:32 PM ------      Message from: Aura Camps      Created: Tue Jun 15, 2012 11:08 AM                   ----- Message -----         From: Ok Edwards, MD         Sent: 06/14/2012  11:50 PM           To: Ted Mcalpine            Please inform patient that her BUN (Blood urea nitrogen) was slightly elevated (Kidney function test). I would like for her to come to the office next week well hydrated and I would like to repeat her BUN. She does not need to be fasting. ------

## 2012-06-24 ENCOUNTER — Other Ambulatory Visit: Payer: BC Managed Care – PPO

## 2012-06-24 DIAGNOSIS — R799 Abnormal finding of blood chemistry, unspecified: Secondary | ICD-10-CM

## 2012-10-09 ENCOUNTER — Other Ambulatory Visit: Payer: Self-pay | Admitting: Neurology

## 2013-01-16 ENCOUNTER — Other Ambulatory Visit: Payer: Self-pay | Admitting: Neurology

## 2013-01-17 NOTE — Telephone Encounter (Signed)
Rx signed and faxed.

## 2013-01-27 ENCOUNTER — Encounter: Payer: Self-pay | Admitting: Gynecology

## 2013-02-27 ENCOUNTER — Other Ambulatory Visit: Payer: Self-pay | Admitting: Neurology

## 2013-03-02 ENCOUNTER — Telehealth: Payer: Self-pay | Admitting: Neurology

## 2013-03-03 ENCOUNTER — Other Ambulatory Visit: Payer: Self-pay | Admitting: Neurology

## 2013-03-03 MED ORDER — CLONAZEPAM 0.5 MG PO TABS: ORAL_TABLET | ORAL | Status: DC

## 2013-03-04 NOTE — Telephone Encounter (Signed)
Pt's prescription faxed over to [facility] at [phone].

## 2013-04-05 ENCOUNTER — Other Ambulatory Visit: Payer: Self-pay | Admitting: Neurology

## 2013-04-06 NOTE — Telephone Encounter (Signed)
Rx faxed

## 2013-04-06 NOTE — Telephone Encounter (Signed)
I think this was accidentally routed to me, please address

## 2013-05-12 ENCOUNTER — Encounter: Payer: Self-pay | Admitting: Nurse Practitioner

## 2013-05-18 ENCOUNTER — Encounter: Payer: Self-pay | Admitting: Nurse Practitioner

## 2013-05-18 ENCOUNTER — Ambulatory Visit (INDEPENDENT_AMBULATORY_CARE_PROVIDER_SITE_OTHER): Payer: BC Managed Care – PPO | Admitting: Nurse Practitioner

## 2013-05-18 ENCOUNTER — Encounter (INDEPENDENT_AMBULATORY_CARE_PROVIDER_SITE_OTHER): Payer: Self-pay

## 2013-05-18 VITALS — BP 97/63 | HR 69 | Ht 64.5 in | Wt 119.0 lb

## 2013-05-18 DIAGNOSIS — G4720 Circadian rhythm sleep disorder, unspecified type: Secondary | ICD-10-CM

## 2013-05-18 MED ORDER — CLONAZEPAM 0.5 MG PO TABS: 0.5000 mg | ORAL_TABLET | Freq: Every day | ORAL | Status: DC

## 2013-05-18 NOTE — Progress Notes (Signed)
[facility]  PATIENT: Sarah Mclean DOB: 1960-10-21   REASON FOR VISIT: Followup for parasomnia and sleepwalking   HISTORY OF PRESENT ILLNESS: Ms. Sarah Mclean, 53 year old female returns for followup. 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 She needs refills on her medication. She was last seen in our office 12/03/2011. She has no new medical issues or neurologic issues.  HISTORY:Patient has been on Klonopin for her sleep disorder for many years.  Her son recently developed sleep behaviors that are reminiscent of hers, talking in his sleep and moving about , rather complex activities. He is 51 years old. Sleep deprived EEG in the past has been normal   REVIEW OF SYSTEMS: Full 14 system review of systems performed and notable only for those listed, all others are neg:  Constitutional: N/A  Cardiovascular: N/A  Ear/Nose/Throat: N/A  Skin: N/A  Eyes: N/A  Respiratory: N/A  Gastroitestinal: N/A  Hematology/Lymphatic: N/A  Endocrine: N/A Musculoskeletal:N/A  Allergy/Immunology: N/A  Neurological: N/A Psychiatric: N/A Sleep: Snoring, sleepwalking, sleep talking, acting out dreams   ALLERGIES: Allergies  Allergen Reactions  . Erythromycin   . Latex   . Penicillins   . Vicodin [Hydrocodone-Acetaminophen]     HOME MEDICATIONS: Outpatient Prescriptions Prior to Visit  Medication Sig Dispense Refill  . Azelaic Acid (FINACEA) 15 % cream Apply topically daily. After skin is thoroughly washed and patted dry, gently but thoroughly massage a thin film of azelaic acid cream into the affected area twice daily, in the morning and evening.      . Calcium Carbonate-Vitamin D (CALCIUM + D PO) Take by mouth as needed.       . cholecalciferol (VITAMIN D) 1000 UNITS tablet Take by mouth daily.      . Multiple Vitamin (MULTIVITAMIN) tablet Take 1 tablet by mouth daily.      . NON FORMULARY Testosterone cream      .  NONFORMULARY OR COMPOUNDED ITEM Testosterone 2% Cream Apply at bedtime as directed.  15 each  5  . clonazePAM (KLONOPIN) 0.5 MG tablet TAKE 1/2 TABLET BY MOUTH AT BEDTIME MAY TAKE ANOTHER 1/2 TABLET IF NECESSARY  30 tablet  1  . metroNIDAZOLE (METROCREAM) 0.75 % cream Apply topically 2 (two) times daily.      . NONFORMULARY OR COMPOUNDED ITEM Estradiol .02% 1 ML Prefilled Applicator Sig: apply vaginally twice a week #90 Day Supply with 4 refills  1 each  4   No facility-administered medications prior to visit.    PAST MEDICAL HISTORY: Past Medical History  Diagnosis Date  . Rosacea   . Osteopenia   . Menopausal state     age 70 onset  . Parasomnia     PAST SURGICAL HISTORY: Past Surgical History  Procedure Laterality Date  . Tonsillectomy    . Rhinoplasty    . Knee arthroscopy    . Cesarean section    . Hemorrhoid surgery    . Dilation and curettage of uterus  2004    suction curettage    FAMILY HISTORY: Family History  Problem Relation Age of Onset  . Colon cancer Sister   . Cancer Sister     colon  . Breast cancer Sister   . Cancer Mother     sarcoma  .       SOCIAL HISTORY: History   Social History  . Marital Status: Married    Spouse Name: Eddie Dibbles    Number of Children: 3  .  Years of Education: 12+4    Occupational History  .     Social History Main Topics  . Smoking status: Never Smoker   . Smokeless tobacco: Never Used  . Alcohol Use: Yes     Comment: rare  . Drug Use: No  . Sexual Activity: Yes    Birth Control/ Protection: Post-menopausal   Other Topics Concern  . Not on file   Social History Narrative   Patient is married Eddie Dibbles) and lives at home with her husband and three children.   Patient has two biological children and one adopted.   Patient drinks one soda on most days.     PHYSICAL EXAM  Filed Vitals:   05/18/13 1335  BP: 97/63  Pulse: 69  Height: 5' 4.5" (1.638 m)  Weight: 119 lb (53.978 kg)   Body mass index is 20.12  kg/(m^2).  Generalized: Well developed, in no acute distress   Neurological examination   Mentation: Alert oriented to time, place, history taking. Follows all commands speech and language fluent. ESS 11.   Cranial nerve II-XII: Pupils were equal round reactive to light extraocular movements were full, visual field were full on confrontational test. Facial sensation and strength were normal. hearing was intact to finger rubbing bilaterally. Uvula tongue midline. head turning and shoulder shrug were normal and symmetric.Tongue protrusion into cheek strength was normal. Motor: normal bulk and tone, full strength in the BUE, BLE,  No focal weakness Coordination: finger-nose-finger, heel-to-shin bilaterally, no dysmetria Reflexes: Brachioradialis 2/2, biceps 2/2, triceps 2/2, patellar 2/2, Achilles 2/2, plantar responses were flexor bilaterally. Gait and Station: Rising up from seated position without assistance, normal stance,  moderate stride, good arm swing, smooth turning, able to perform tiptoe, and heel walking without difficulty. Tandem gait is steady  DIAGNOSTIC DATA (LABS, IMAGING, TESTING) - I reviewed patient records, labs, notes, testing and imaging myself where available.  Lab Results  Component Value Date   WBC 5.5 06/14/2012   HGB 12.9 06/14/2012   HCT 37.7 06/14/2012   MCV 87.5 06/14/2012   PLT 208 06/14/2012      Component Value Date/Time   NA 137 06/14/2012 1501   K 3.7 06/14/2012 1501   CL 107 06/14/2012 1501   CO2 29 06/14/2012 1501   GLUCOSE 85 06/14/2012 1501   BUN 29* 06/24/2012 1522   CREATININE 0.71 06/14/2012 1501   CALCIUM 9.4 06/14/2012 1501   PROT 6.5 06/14/2012 1501   ALBUMIN 4.6 06/14/2012 1501   AST 19 06/14/2012 1501   ALT 14 06/14/2012 1501   ALKPHOS 43 06/14/2012 1501   BILITOT 1.1 06/14/2012 1501   Lab Results  Component Value Date   CHOL 178 06/14/2012   HDL 65 06/12/2011    Lab Results  Component Value Date   TSH 0.858 06/14/2012      ASSESSMENT AND  PLAN  53 y.o. year old female  has a past medical history of Rosacea; Osteopenia; Menopausal state; and Parasomnia. here in followup. Her parasomnia and sleepwalking is well controlled on Klonopin  Continue Klonopin at current dose Followup yearly and when necessary Dennie Bible, [facility], [facility], [facility] [address] [place], [place] [address] [phone]

## 2013-05-18 NOTE — Patient Instructions (Signed)
Continue Klonopin at current dose Followup yearly and when necessary

## 2013-06-15 ENCOUNTER — Encounter: Payer: BC Managed Care – PPO | Admitting: Gynecology

## 2013-06-16 ENCOUNTER — Encounter: Payer: Self-pay | Admitting: Gynecology

## 2013-06-20 ENCOUNTER — Ambulatory Visit (INDEPENDENT_AMBULATORY_CARE_PROVIDER_SITE_OTHER): Payer: BC Managed Care – PPO | Admitting: Gynecology

## 2013-06-20 ENCOUNTER — Encounter: Payer: Self-pay | Admitting: Gynecology

## 2013-06-20 VITALS — BP 112/70 | Ht 64.0 in | Wt 117.0 lb

## 2013-06-20 DIAGNOSIS — R6882 Decreased libido: Secondary | ICD-10-CM

## 2013-06-20 DIAGNOSIS — Z8639 Personal history of other endocrine, nutritional and metabolic disease: Secondary | ICD-10-CM

## 2013-06-20 DIAGNOSIS — M8580 Other specified disorders of bone density and structure, unspecified site: Secondary | ICD-10-CM

## 2013-06-20 DIAGNOSIS — N952 Postmenopausal atrophic vaginitis: Secondary | ICD-10-CM

## 2013-06-20 DIAGNOSIS — M949 Disorder of cartilage, unspecified: Secondary | ICD-10-CM

## 2013-06-20 DIAGNOSIS — M899 Disorder of bone, unspecified: Secondary | ICD-10-CM

## 2013-06-20 DIAGNOSIS — Z01419 Encounter for gynecological examination (general) (routine) without abnormal findings: Secondary | ICD-10-CM

## 2013-06-20 MED ORDER — NONFORMULARY OR COMPOUNDED ITEM: Status: DC

## 2013-06-20 NOTE — Progress Notes (Signed)
Sarah Mclean 1960-05-17 [id number]   History:    53 y.o.  for annual gyn exam was only main complaint today is occasional decreased libido. My partner had had her on the past on testosterone 2% cream which she will apply periclitoraly when necessary and is requesting a refill. Patient did have an [place] in 2010 which was found to be elevated with a value of 114.My partner in the past had offered her hormone replacement therapy but patient was adamant. Review of her record indicated that in January 2013 her bone density study had demonstrated that she was in the osteopenic range but had significant decreased bone mineralization in the AP spine and left hip but normal FRAX. Patient is taking calcium and vitamin D daily and exercising regularly.patient's had 2 prior colonoscopy the last one was 2014 which was normal.She has one sister who had breast cancer and was tested for the BRCA1 / BRCA2 gene and was negative. Another sister had colon cancer the age of 17. Patient with no past history of abnormal Pap smear. Her vaccines are up to date. Her PCP has been doing her blood work.  Past medical history,surgical history, family history and social history were all reviewed and documented in the EPIC chart.  Gynecologic History No LMP recorded. Patient is postmenopausal. Contraception: none Last Pap: 2013. Results were: normal Last mammogram: 2014. Results were: normal  Obstetric History OB History  Gravida Para Term Preterm AB SAB TAB Ectopic Multiple Living  _0 # Outcome Date GA Lbr Len/2nd Weight Sex Delivery Anes PTL Lv  6 SAB           5 SAB           4 SAB           3 SAB           2 TRM           1 TRM                ROS: A ROS was performed and pertinent positives and negatives are included in the history.  GENERAL: No fevers or chills. HEENT: No change in vision, no earache, sore throat or sinus congestion. NECK: No pain or stiffness. CARDIOVASCULAR: No chest pain or  pressure. No palpitations. PULMONARY: No shortness of breath, cough or wheeze. GASTROINTESTINAL: No abdominal pain, nausea, vomiting or diarrhea, melena or bright red blood per rectum. GENITOURINARY: No urinary frequency, urgency, hesitancy or dysuria. MUSCULOSKELETAL: No joint or muscle pain, no back pain, no recent trauma. DERMATOLOGIC: No rash, no itching, no lesions. ENDOCRINE: No polyuria, polydipsia, no heat or cold intolerance. No recent change in weight. HEMATOLOGICAL: No anemia or easy bruising or bleeding. NEUROLOGIC: No headache, seizures, numbness, tingling or weakness. PSYCHIATRIC: No depression, no loss of interest in normal activity or change in sleep pattern.     Exam: chaperone present  BP 112/70  Ht _1  (1.626 m)  Wt 117 lb (53.071 kg)  BMI 20.07 kg/m2  Body mass index is 20.07 kg/(m^2).  General appearance : Well developed well nourished female. No acute distress HEENT: Neck supple, trachea midline, no carotid bruits, no thyroidmegaly Lungs: Clear to auscultation, no rhonchi or wheezes, or rib retractions  Heart: Regular rate and rhythm, no murmurs or gallops Breast:Examined in sitting and supine position were symmetrical in appearance, no palpable masses or tenderness,  no skin retraction, no nipple inversion, no  nipple discharge, no skin discoloration, no axillary or supraclavicular lymphadenopathy Abdomen: no palpable masses or tenderness, no rebound or guarding Extremities: no edema or skin discoloration or tenderness  Pelvic:  Bartholin, Urethra, Skene Glands: Within normal limits             Vagina: No gross lesions or discharge  Cervix: No gross lesions or discharge  Uterus  anteverted, normal size, shape and consistency, non-tender and mobile  Adnexa  Without masses or tenderness  Anus and perineum  normal   Rectovaginal  normal sphincter tone without palpated masses or tenderness             Hemoccult colonoscopy less than a year ago     Assessment/Plan:   53 y.o. female for annual exam menopausal with some decreased libido. Testosterone 2% cream was prescribed to apply periclitoral when necessary. Risks benefits and pros and cons were discussed. Patient will be [facility] geneticist for possible testing for Lynch Syndrome and BRCA1 and BRCA2 because of strong family history of colon and breast cancer. The patient was reminded to do her monthly breast exam. When she had her mammogram to share recommended she have a 3-D. We discussed importance of calcium vitamin D and regular exercise for osteoporosis prevention. Patient will be scheduled her bone density study this year.  Note: This dictation was prepared with  Dragon/digital dictation along withSmart phrase technology. Any transcriptional errors that result from this process are unintentional.   Terrance Mass MD, 3:02 PM 06/20/2013

## 2013-06-20 NOTE — Patient Instructions (Signed)
Bone Densitometry Bone densitometry is a special X-ray that measures your bone density and can be used to help predict your risk of bone fractures. This test is used to determine bone mineral content and density to diagnose osteoporosis. Osteoporosis is the loss of bone that may cause the bone to become weak. Osteoporosis commonly occurs in women entering menopause. However, it may be found in men and in people with other diseases. PREPARATION FOR TEST No preparation necessary. WHO SHOULD BE TESTED?  All women older than 65.  Postmenopausal women (50 to 65) with risk factors for osteoporosis.  People with a previous fracture caused by normal activities.  People with a small body frame (less than 127 poundsor a body mass index [BMI] of less than 21).  People who have a parent with a hip fracture or history of osteoporosis.  People who smoke.  People who have rheumatoid arthritis.  Anyone who engages in excessive alcohol use (more than 3 drinks most days).  Women who experience early menopause. WHEN SHOULD YOU BE RETESTED? Current guidelines suggest that you should wait at least 2 years before doing a bone density test again if your first test was normal.Recent studies indicated that women with normal bone density may be able to wait a few years before needing to repeat a bone density test. You should discuss this with your caregiver.  NORMAL FINDINGS   Normal: less than standard deviation below normal (greater than -1).  Osteopenia: 1 to 2.5 standard deviations below normal (-1 to -2.5).  Osteoporosis: greater than 2.5 standard deviations below normal (less than -2.5). Test results are reported as a "T score" and a "Z score."The T score is a number that compares your bone density with the bone density of healthy, young women.The Z score is a number that compares your bone density with the scores of women who are the same age, gender, and race.  Ranges for normal findings may vary  among different laboratories and hospitals. You should always check with your doctor after having lab work or other tests done to discuss the meaning of your test results and whether your values are considered within normal limits. MEANING OF TEST  Your caregiver will go over the test results with you and discuss the importance and meaning of your results, as well as treatment options and the need for additional tests if necessary. OBTAINING THE TEST RESULTS It is your responsibility to obtain your test results. Ask the lab or department performing the test when and how you will get your results. Document Released: 05/06/2004 Document Revised: 07/07/2011 Document Reviewed: 05/29/2010 Exit[employer] Patient Information 2014 [employer].  

## 2013-06-21 LAB — URINALYSIS W MICROSCOPIC + REFLEX CULTURE
BACTERIA UA: NONE SEEN
Bilirubin Urine: NEGATIVE
Casts: NONE SEEN
Crystals: NONE SEEN
Glucose, UA: NEGATIVE mg/dL
HGB URINE DIPSTICK: NEGATIVE
Ketones, ur: NEGATIVE mg/dL
Leukocytes, UA: NEGATIVE
NITRITE: NEGATIVE
PH: 7 (ref 5.0–8.0)
Protein, ur: NEGATIVE mg/dL
Specific Gravity, Urine: 1.017 (ref 1.005–1.030)
Squamous Epithelial / LPF: NONE SEEN
Urobilinogen, UA: 0.2 mg/dL (ref 0.0–1.0)

## 2013-07-01 ENCOUNTER — Other Ambulatory Visit: Payer: BC Managed Care – PPO

## 2013-07-05 ENCOUNTER — Other Ambulatory Visit: Payer: BC Managed Care – PPO

## 2013-07-22 ENCOUNTER — Other Ambulatory Visit: Payer: BC Managed Care – PPO

## 2013-07-22 LAB — THYROID PANEL WITH TSH
Free Thyroxine Index: 3.5 (ref 1.0–3.9)
T3 UPTAKE: 36.8 % (ref 22.5–37.0)
T4, Total: 9.4 ug/dL (ref 5.0–12.5)
TSH: 1.701 u[IU]/mL (ref 0.350–4.500)

## 2013-07-22 LAB — CBC WITH DIFFERENTIAL/PLATELET
BASOS ABS: 0 10*3/uL (ref 0.0–0.1)
BASOS PCT: 1 % (ref 0–1)
EOS PCT: 2 % (ref 0–5)
Eosinophils Absolute: 0.1 10*3/uL (ref 0.0–0.7)
HEMATOCRIT: 32.2 % — AB (ref 36.0–46.0)
Hemoglobin: 10.8 g/dL — ABNORMAL LOW (ref 12.0–15.0)
Lymphocytes Relative: 34 % (ref 12–46)
Lymphs Abs: 1.1 10*3/uL (ref 0.7–4.0)
MCH: 29.9 pg (ref 26.0–34.0)
MCHC: 33.5 g/dL (ref 30.0–36.0)
MCV: 89.2 fL (ref 78.0–100.0)
MONO ABS: 0.4 10*3/uL (ref 0.1–1.0)
Monocytes Relative: 11 % (ref 3–12)
NEUTROS ABS: 1.7 10*3/uL (ref 1.7–7.7)
Neutrophils Relative %: 52 % (ref 43–77)
Platelets: 233 10*3/uL (ref 150–400)
RBC: 3.61 MIL/uL — ABNORMAL LOW (ref 3.87–5.11)
RDW: 13.9 % (ref 11.5–15.5)
WBC: 3.2 10*3/uL — ABNORMAL LOW (ref 4.0–10.5)

## 2013-07-22 LAB — COMPREHENSIVE METABOLIC PANEL
ALT: 18 U/L (ref 0–35)
AST: 21 U/L (ref 0–37)
Albumin: 4.5 g/dL (ref 3.5–5.2)
Alkaline Phosphatase: 43 U/L (ref 39–117)
BILIRUBIN TOTAL: 0.9 mg/dL (ref 0.2–1.2)
BUN: 19 mg/dL (ref 6–23)
CALCIUM: 9.2 mg/dL (ref 8.4–10.5)
CHLORIDE: 104 meq/L (ref 96–112)
CO2: 27 meq/L (ref 19–32)
CREATININE: 0.72 mg/dL (ref 0.50–1.10)
GLUCOSE: 86 mg/dL (ref 70–99)
Potassium: 3.9 mEq/L (ref 3.5–5.3)
Sodium: 139 mEq/L (ref 135–145)
Total Protein: 6.3 g/dL (ref 6.0–8.3)

## 2013-07-22 LAB — LIPID PANEL
Cholesterol: 162 mg/dL (ref 0–200)
HDL: 73 mg/dL (ref >39)
LDL CALC: 78 mg/dL (ref 0–99)
TRIGLYCERIDES: 53 mg/dL (ref <150)
Total CHOL/HDL Ratio: 2.2 Ratio
VLDL: 11 mg/dL (ref 0–40)

## 2013-07-23 LAB — VITAMIN D 25 HYDROXY (VIT D DEFICIENCY, FRACTURES): VIT D 25 HYDROXY: 45 ng/mL (ref 30–89)

## 2013-07-25 ENCOUNTER — Telehealth: Payer: Self-pay | Admitting: *Deleted

## 2013-07-25 NOTE — Telephone Encounter (Signed)
1. Pt is due to schedule bone density this year asked if okay to postpone dexa until next year? Due to having bad insurance this year.  2. Pt said you mention to her about a "lank study" last year to read she can't seem to find websit it is regarding breast and colon cancer. Please advise

## 2013-07-25 NOTE — Telephone Encounter (Signed)
Bone Density next year OK  Lynch Syndrome (Colorectal cancer screening in patient with strong family history)

## 2013-07-25 NOTE — Telephone Encounter (Signed)
Butch Penny informed to relay to patient

## 2013-07-26 ENCOUNTER — Telehealth: Payer: Self-pay

## 2013-07-26 NOTE — Telephone Encounter (Signed)
Message copied by Ramond Craver on Tue Jul 26, 2013  4:34 PM ------      Message from: Terrance Mass      Created: Fri Jul 22, 2013  4:56 PM       Please inform patient that she is a little anemic and I would like for her to begin taking one iron tablet daily. Also if she has not done so. We can send her a hemocult stool card for her to send [place] for testing in the office. I know she is upto date on her colonoscopy ------

## 2013-07-26 NOTE — Telephone Encounter (Signed)
Patient asked would she stay on the iron indefinitely?  Any follow-up needed?

## 2013-07-26 NOTE — Telephone Encounter (Signed)
Tell her to stay on it indefinitely. I would like to repeat CBC in 6 months

## 2013-07-27 ENCOUNTER — Other Ambulatory Visit: Payer: Self-pay | Admitting: Gynecology

## 2013-07-27 DIAGNOSIS — D649 Anemia, unspecified: Secondary | ICD-10-CM

## 2013-07-27 NOTE — Telephone Encounter (Signed)
Patient informed. Order and recall put in system.

## 2013-08-17 ENCOUNTER — Other Ambulatory Visit: Payer: BC Managed Care – PPO | Admitting: Anesthesiology

## 2013-08-17 DIAGNOSIS — Z1211 Encounter for screening for malignant neoplasm of colon: Secondary | ICD-10-CM

## 2013-09-16 ENCOUNTER — Other Ambulatory Visit: Payer: Self-pay | Admitting: Family Medicine

## 2013-09-16 ENCOUNTER — Ambulatory Visit
Admission: RE | Admit: 2013-09-16 | Discharge: 2013-09-16 | Disposition: A | Discharge status: Home / Self Care | Payer: BC Managed Care – PPO | Source: Ambulatory Visit | Attending: Family Medicine | Admitting: Family Medicine

## 2013-09-16 DIAGNOSIS — R079 Chest pain, unspecified: Secondary | ICD-10-CM

## 2013-11-19 ENCOUNTER — Other Ambulatory Visit: Payer: Self-pay | Admitting: Nurse Practitioner

## 2013-11-22 ENCOUNTER — Other Ambulatory Visit: Payer: Self-pay

## 2013-11-22 MED ORDER — CLONAZEPAM 0.5 MG PO TABS: 0.5000 mg | ORAL_TABLET | Freq: Every day | ORAL | Status: DC

## 2013-11-22 NOTE — Telephone Encounter (Signed)
Rx has been faxed.

## 2014-02-08 ENCOUNTER — Ambulatory Visit
Admission: RE | Admit: 2014-02-08 | Discharge: 2014-02-08 | Disposition: A | Discharge status: Home / Self Care | Payer: BC Managed Care – PPO | Source: Ambulatory Visit | Attending: Family Medicine | Admitting: Family Medicine

## 2014-02-08 ENCOUNTER — Other Ambulatory Visit: Payer: Self-pay | Admitting: Family Medicine

## 2014-02-08 DIAGNOSIS — R1084 Generalized abdominal pain: Secondary | ICD-10-CM

## 2014-02-10 ENCOUNTER — Other Ambulatory Visit: Payer: BC Managed Care – PPO

## 2014-02-10 DIAGNOSIS — D649 Anemia, unspecified: Secondary | ICD-10-CM

## 2014-02-10 LAB — CBC WITH DIFFERENTIAL/PLATELET
BASOS ABS: 0 10*3/uL (ref 0.0–0.1)
Basophils Relative: 1 % (ref 0–1)
Eosinophils Absolute: 0 10*3/uL (ref 0.0–0.7)
Eosinophils Relative: 1 % (ref 0–5)
HEMATOCRIT: 36.7 % (ref 36.0–46.0)
HEMOGLOBIN: 12.8 g/dL (ref 12.0–15.0)
LYMPHS PCT: 25 % (ref 12–46)
Lymphs Abs: 1 10*3/uL (ref 0.7–4.0)
MCH: 30.8 pg (ref 26.0–34.0)
MCHC: 34.9 g/dL (ref 30.0–36.0)
MCV: 88.4 fL (ref 78.0–100.0)
MONO ABS: 0.5 10*3/uL (ref 0.1–1.0)
Monocytes Relative: 12 % (ref 3–12)
NEUTROS PCT: 61 % (ref 43–77)
Neutro Abs: 2.4 10*3/uL (ref 1.7–7.7)
Platelets: 217 10*3/uL (ref 150–400)
RBC: 4.15 MIL/uL (ref 3.87–5.11)
RDW: 13.8 % (ref 11.5–15.5)
WBC: 3.9 10*3/uL — AB (ref 4.0–10.5)

## 2014-02-27 ENCOUNTER — Encounter: Payer: Self-pay | Admitting: Gynecology

## 2014-05-18 ENCOUNTER — Encounter: Payer: Self-pay | Admitting: Nurse Practitioner

## 2014-05-18 ENCOUNTER — Ambulatory Visit (INDEPENDENT_AMBULATORY_CARE_PROVIDER_SITE_OTHER): Payer: 59 | Admitting: Nurse Practitioner

## 2014-05-18 VITALS — BP 111/71 | HR 71 | Ht 64.0 in | Wt 118.0 lb

## 2014-05-18 DIAGNOSIS — G4720 Circadian rhythm sleep disorder, unspecified type: Secondary | ICD-10-CM

## 2014-05-18 DIAGNOSIS — G478 Other sleep disorders: Secondary | ICD-10-CM

## 2014-05-18 MED ORDER — CLONAZEPAM 0.5 MG PO TABS: 0.5000 mg | ORAL_TABLET | Freq: Every day | ORAL | Status: DC

## 2014-05-18 NOTE — Patient Instructions (Signed)
Continue Klonopin at current dose Rx to patient Follow-up yearly and when necessary

## 2014-05-18 NOTE — Progress Notes (Signed)
[facility]  PATIENT: Sarah Mclean DOB: 13-Jul-1960   REASON FOR VISIT: Follow-up for parasomnia and sleepwalking  HISTORY FROM: Patient  HISTORY OF PRESENT ILLNESS:Ms. Vasques, 54 year old female returns for followup. 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 She needs refills on her medication. She was last seen in our office 05/18/13. She has no new medical issues or neurologic issues. She returns for reevaluation  HISTORY:Patient has been on Klonopin for her sleep disorder for many years.  Her son recently developed sleep behaviors that are reminiscent of hers, talking in his sleep and moving about , rather complex activities. He is 52 years old. Sleep deprived EEG in the past has been normal REVIEW OF SYSTEMS: Full 14 system review of systems performed and notable only for those listed, all others are neg:  Constitutional: neg  Cardiovascular: neg Ear/Nose/Throat: neg  Skin: neg Eyes: neg Respiratory: Cough Gastroitestinal: neg  Hematology/Lymphatic: neg  Endocrine: neg Musculoskeletal:neg Allergy/Immunology: neg Neurological: neg Psychiatric: neg Sleep : neg   ALLERGIES: Allergies  Allergen Reactions  . Erythromycin   . Latex   . Penicillins   . Vicodin [Hydrocodone-Acetaminophen]     HOME MEDICATIONS: Outpatient Prescriptions Prior to Visit  Medication Sig Dispense Refill  . Calcium Carbonate-Vitamin D (CALCIUM + D PO) Take by mouth as needed.     . clonazePAM (KLONOPIN) 0.5 MG tablet Take 1 tablet (0.5 mg total) by mouth at bedtime. 30 tablet 5  . Multiple Vitamin (MULTIVITAMIN) tablet Take 1 tablet by mouth daily.    . NONFORMULARY OR COMPOUNDED ITEM Testosterone 2% Cream Apply at bedtime as directed. 15 each 5  . Azelaic Acid (FINACEA) 15 % cream Apply topically daily. After skin is thoroughly washed and patted dry, gently but thoroughly massage a thin film of azelaic acid cream into the  affected area twice daily, in the morning and evening.     No facility-administered medications prior to visit.    PAST MEDICAL HISTORY: Past Medical History  Diagnosis Date  . Rosacea   . Osteopenia   . Menopausal state     age 81 onset  . Parasomnia     PAST SURGICAL HISTORY: Past Surgical History  Procedure Laterality Date  . Tonsillectomy    . Rhinoplasty    . Knee arthroscopy    . Cesarean section    . Hemorrhoid surgery    . Dilation and curettage of uterus  2004    suction curettage    FAMILY HISTORY: Family History  Problem Relation Age of Onset  . Colon cancer Sister   . Cancer Sister     colon  . Breast cancer Sister   . Cancer Mother     sarcoma  .       SOCIAL HISTORY: History   Social History  . Marital Status: Married    Spouse Name: Eddie Dibbles    Number of Children: 3  . Years of Education: 12+4    Occupational History  .     Social History Main Topics  . Smoking status: Never Smoker   . Smokeless tobacco: Never Used  . Alcohol Use: Yes     Comment: rare  . Drug Use: No  . Sexual Activity: Yes    Birth Control/ Protection: Post-menopausal   Other Topics Concern  . Not on file   Social History Narrative   Patient is married Eddie Dibbles) and lives at home with her husband and three children.  Patient has two biological children and one adopted.   Patient drinks one soda on most days.     PHYSICAL EXAM  Filed Vitals:   05/18/14 1347  BP: 111/71  Pulse: 71  Height: 5\' 4"  (1.626 m)  Weight: 118 lb (53.524 kg)   Body mass index is 20.24 kg/(m^2). Generalized: Well developed, in no acute distress   Neurological examination   Mentation: Alert oriented to time, place, history taking. Follows all commands speech and language fluent.   Cranial nerve II-XII: Pupils were equal round reactive to light extraocular movements were full, visual field were full on confrontational test. Facial sensation and strength were normal. hearing was intact  to finger rubbing bilaterally. Uvula tongue midline. head turning and shoulder shrug were normal and symmetric.Tongue protrusion into cheek strength was normal. Motor: normal bulk and tone, full strength in the BUE, BLE, No focal weakness Coordination: finger-nose-finger, heel-to-shin bilaterally, no dysmetria Reflexes: Brachioradialis 2/2, biceps 2/2, triceps 2/2, patellar 2/2, Achilles 2/2, plantar responses were flexor bilaterally. Gait and Station: Rising up from seated position without assistance, normal stance, moderate stride, good arm swing, smooth turning, able to perform tiptoe, and heel walking without difficulty. Tandem gait is steady  DIAGNOSTIC DATA (LABS, IMAGING, TESTING) - I reviewed patient records, labs, notes, testing and imaging myself where available.  Lab Results  Component Value Date   WBC 3.9* 02/10/2014   HGB 12.8 02/10/2014   HCT 36.7 02/10/2014   MCV 88.4 02/10/2014   PLT 217 02/10/2014      Component Value Date/Time   NA 139 07/22/2013 0850   K 3.9 07/22/2013 0850   CL 104 07/22/2013 0850   CO2 27 07/22/2013 0850   GLUCOSE 86 07/22/2013 0850   BUN 19 07/22/2013 0850   CREATININE 0.72 07/22/2013 0850   CALCIUM 9.2 07/22/2013 0850   PROT 6.3 07/22/2013 0850   ALBUMIN 4.5 07/22/2013 0850   AST 21 07/22/2013 0850   ALT 18 07/22/2013 0850   ALKPHOS 43 07/22/2013 0850   BILITOT 0.9 07/22/2013 0850   Lab Results  Component Value Date   CHOL 162 07/22/2013   HDL 73 07/22/2013   LDLCALC 78 07/22/2013   TRIG 53 07/22/2013   CHOLHDL 2.2 07/22/2013   No results found for: [id number] Lab Results  Component Value Date   TSH 1.701 07/22/2013      ASSESSMENT AND PLAN  54 y.o. year old female  has a past medical history of  Osteopenia; Menopausal state; and Parasomnia. here to follow-up for her parasomnia. She is doing well on Klonopin. The patient is a current patient of Dr. Brett Fairy  who is out of the office this afternoon.  This note is sent to the  work in doctor.     Continue Klonopin at current dose Rx to patient Follow-up yearly and when necessary Dennie Bible, [facility], [facility], APRN  [facility] [address] [place], [place] [address] [phone]

## 2014-05-19 NOTE — Progress Notes (Signed)
I agree with the above plan 

## 2014-07-07 ENCOUNTER — Telehealth: Payer: Self-pay | Admitting: Neurology

## 2014-07-07 NOTE — Telephone Encounter (Signed)
Please offer a revisit to discuss.

## 2014-07-07 NOTE — Telephone Encounter (Signed)
Pt is calling stating trying to determine what causes her sleep walking and she would like a call back from Dr. Brett Fairy regarding this matter.  Please advise.

## 2014-07-10 NOTE — Telephone Encounter (Signed)
LMVM for pt to make appt for RV to discuss.

## 2014-07-11 ENCOUNTER — Encounter: Payer: 59 | Admitting: Gynecology

## 2014-07-11 NOTE — Telephone Encounter (Signed)
Patient is returning call but can not talk to after 130 today.

## 2014-07-11 NOTE — Telephone Encounter (Signed)
LMVM again for pt to return call to set up appt for RV to discuss questions/concerns.

## 2014-07-13 NOTE — Telephone Encounter (Signed)
I spoke to pt and she is wanting to know what substance is deficient with sleep walkers.  (with narcolepsy there is too much of this).

## 2014-07-13 NOTE — Telephone Encounter (Signed)
I asked what substance is deficient in sleep walkers and and narcolepsy (per Dr. Brett Fairy orexin or hypocretin).  I LMVM for pt on her VM.

## 2014-07-13 NOTE — Telephone Encounter (Signed)
LMVM for pt that returning her call about RV appt.

## 2014-07-20 ENCOUNTER — Ambulatory Visit (INDEPENDENT_AMBULATORY_CARE_PROVIDER_SITE_OTHER): Payer: 59 | Admitting: Gynecology

## 2014-07-20 ENCOUNTER — Encounter: Payer: Self-pay | Admitting: Gynecology

## 2014-07-20 ENCOUNTER — Other Ambulatory Visit (HOSPITAL_COMMUNITY)
Admission: RE | Admit: 2014-07-20 | Discharge: 2014-07-20 | Disposition: A | Discharge status: Home / Self Care | Payer: 59 | Source: Ambulatory Visit | Attending: Gynecology | Admitting: Gynecology

## 2014-07-20 VITALS — BP 110/76 | Ht 64.0 in | Wt 117.0 lb

## 2014-07-20 DIAGNOSIS — Z780 Asymptomatic menopausal state: Secondary | ICD-10-CM | POA: Diagnosis not present

## 2014-07-20 DIAGNOSIS — Z01419 Encounter for gynecological examination (general) (routine) without abnormal findings: Secondary | ICD-10-CM

## 2014-07-20 DIAGNOSIS — Z1151 Encounter for screening for human papillomavirus (HPV): Secondary | ICD-10-CM | POA: Insufficient documentation

## 2014-07-20 DIAGNOSIS — M8580 Other specified disorders of bone density and structure, unspecified site: Secondary | ICD-10-CM | POA: Diagnosis not present

## 2014-07-20 DIAGNOSIS — Z79890 Hormone replacement therapy (postmenopausal): Secondary | ICD-10-CM | POA: Diagnosis not present

## 2014-07-20 DIAGNOSIS — Z8639 Personal history of other endocrine, nutritional and metabolic disease: Secondary | ICD-10-CM | POA: Diagnosis not present

## 2014-07-20 DIAGNOSIS — N952 Postmenopausal atrophic vaginitis: Secondary | ICD-10-CM

## 2014-07-20 LAB — WET PREP FOR TRICH, YEAST, CLUE
Clue Cells Wet Prep HPF POC: NONE SEEN
Trich, Wet Prep: NONE SEEN
Yeast Wet Prep HPF POC: NONE SEEN

## 2014-07-20 MED ORDER — ESTRADIOL 10 MCG VA TABS: 1.0000 | ORAL_TABLET | VAGINAL | Status: DC

## 2014-07-20 MED ORDER — NONFORMULARY OR COMPOUNDED ITEM: Status: DC

## 2014-07-20 NOTE — Patient Instructions (Signed)
Estradiol vaginal tablets What is this medicine? ESTRADIOL (es tra DYE ole) vaginal tablet is used to help relieve symptoms of vaginal irritation and dryness that occurs in some women during menopause. This medicine may be used for other purposes; ask your health care provider or pharmacist if you have questions. COMMON BRAND NAME(S): Vagifem What should I tell my health care provider before I take this medicine? They need to know if you have any of these conditions: -abnormal vaginal bleeding -blood vessel disease or blood clots -breast, cervical, endometrial, ovarian, liver, or uterine cancer -dementia -diabetes -gallbladder disease -heart disease or recent heart attack -high blood pressure -high cholesterol -high level of calcium in the blood -hysterectomy -kidney disease -liver disease -migraine headaches -protein C deficiency -protein S deficiency -stroke -systemic lupus erythematosus (SLE) -tobacco smoker -an unusual or allergic reaction to estrogens, other hormones, medicines, foods, dyes, or preservatives -pregnant or trying to get pregnant -breast-feeding How should I use this medicine? This medicine is only for use in the vagina. Do not take by mouth. Wash your hands before and after use. Read package directions carefully. Unwrap the pre-filled applicator package. Lie on your back, part and bend your knees. Gently insert the applicator tip high in the vagina and push the plunger to release the tablet into the vagina. Gently remove the applicator. Throw away the applicator after use. Do not use your medicine more often than directed. Finish the full course prescribed by your doctor or health care professional even if you think your condition is better. Do not stop using except on the advice of your doctor or health care professional. Talk to your pediatrician regarding the use of this medicine in children. A patient package insert for the product will be given with each  prescription and refill. Read this sheet carefully each time. The sheet may change frequently. Overdosage: If you think you have taken too much of this medicine contact a poison control center or emergency room at once. NOTE: This medicine is only for you. Do not share this medicine with others. What if I miss a dose? If you miss a dose, take it as soon as you can. If it is almost time for your next dose, take only that dose. Do not take double or extra doses. What may interact with this medicine? Do not take this medicine with any of the following medications: -aromatase inhibitors like aminoglutethimide, anastrozole, exemestane, letrozole, testolactone This medicine may also interact with the following medications: -antibiotics used to treat tuberculosis like rifabutin, rifampin and rifapentene -raloxifene or tamoxifen -warfarin This list may not describe all possible interactions. Give your health care provider a list of all the medicines, herbs, non-prescription drugs, or dietary supplements you use. Also tell them if you smoke, drink alcohol, or use illegal drugs. Some items may interact with your medicine. What should I watch for while using this medicine? Visit your health care professional for regular checks on your progress. You will need a regular breast and pelvic exam. You should also discuss the need for regular mammograms with your health care professional, and follow his or her guidelines. This medicine can make your body retain fluid, making your fingers, hands, or ankles swell. Your blood pressure can go up. Contact your doctor or health care professional if you feel you are retaining fluid. If you have any reason to think you are pregnant; stop taking this medicine at once and contact your doctor or health care professional. Tobacco smoking increases the risk of getting  a blood clot or having a stroke, especially if you are more than 52 years old. You are strongly advised not to  smoke. If you wear contact lenses and notice visual changes, or if the lenses begin to feel uncomfortable, consult your eye care specialist. If you are going to have elective surgery, you may need to stop taking this medicine beforehand. Consult your health care professional for advice prior to scheduling the surgery. What side effects may I notice from receiving this medicine? Side effects that you should report to your doctor or health care professional as soon as possible: -allergic reactions like skin rash, itching or hives, swelling of the face, lips, or tongue -breast tissue changes or discharge -changes in vision -chest pain -confusion, trouble speaking or understanding -dark urine -general ill feeling or flu-like symptoms -light-colored stools -nausea, vomiting -pain, swelling, warmth in the leg -right upper belly pain -severe headaches -shortness of breath -sudden numbness or weakness of the face, arm or leg -trouble walking, dizziness, loss of balance or coordination -unusual vaginal bleeding -yellowing of the eyes or skin Side effects that usually do not require medical attention (report to your doctor or health care professional if they continue or are bothersome): -hair loss -increased hunger or thirst -increased urination -symptoms of vaginal infection like itching, irritation or unusual discharge -unusually weak or tired This list may not describe all possible side effects. Call your doctor for medical advice about side effects. You may report side effects to FDA at 1-800-FDA-1088. Where should I keep my medicine? Keep out of the reach of children. Store at room temperature between 15 and 30 degrees C (59 and 86 degrees F). Throw away any unused medicine after the expiration date. NOTE: This sheet is a summary. It may not cover all possible information. If you have questions about this medicine, talk to your doctor, pharmacist, or health care provider.  2015,  [employer]vier/Gold Standard. (2010-07-17 09:08:58)

## 2014-07-20 NOTE — Progress Notes (Signed)
Sarah Mclean 25-Dec-1960 [id number]   History:    54 y.o.  for annual gyn exam with complaint only of vulvar pruritus. Patient passes suffer from decreased libido  and had used 2% testosterone cream periclitoral a when necessary and has worked well and she wanted a refill. She has not used it in quite some time.Patient did have an [place] in 2010 which was found to be elevated with a value of 114.My partner in the past had offered her hormone replacement therapy but patient was adamant. Review of her record indicated that in January 2013 her bone density study had demonstrated that she was in the osteopenic range but had significant decreased bone mineralization in the AP spine and left hip but normal FRAX. Patient is taking calcium and vitamin D daily and exercising regularly.patient's had 2 prior colonoscopy the last one was 2014 which was normal.She has one sister who had breast cancer and was tested for the BRCA1 / BRCA2 gene and was negative. Another sister had colon cancer the age of 29. Patient with no past history of abnormal Pap smear. Her vaccines are up to date. Her PCP has been doing her blood work. She has had history in the past vitamin D deficiency.   Past medical history,surgical history, family history and social history were all reviewed and documented in the EPIC chart.  Gynecologic History No LMP recorded. Patient is postmenopausal. Contraception: post menopausal status Last Pap: 2013. Results were: normal Last mammogram: October 2015. Results were: normal  Obstetric History OB History  Gravida Para Term Preterm AB SAB TAB Ectopic Multiple Living  '6 2 2  4 4    2    ' # Outcome Date GA Lbr Len/2nd Weight Sex Delivery Anes PTL Lv  6 SAB           5 SAB           4 SAB           3 SAB           2 Term           1 Term                ROS: A ROS was performed and pertinent positives and negatives are included in the history.  GENERAL: No fevers or chills. HEENT: No  change in vision, no earache, sore throat or sinus congestion. NECK: No pain or stiffness. CARDIOVASCULAR: No chest pain or pressure. No palpitations. PULMONARY: No shortness of breath, cough or wheeze. GASTROINTESTINAL: No abdominal pain, nausea, vomiting or diarrhea, melena or bright red blood per rectum. GENITOURINARY: No urinary frequency, urgency, hesitancy or dysuria. MUSCULOSKELETAL: No joint or muscle pain, no back pain, no recent trauma. DERMATOLOGIC: No rash, no itching, no lesions. ENDOCRINE: No polyuria, polydipsia, no heat or cold intolerance. No recent change in weight. HEMATOLOGICAL: No anemia or easy bruising or bleeding. NEUROLOGIC: No headache, seizures, numbness, tingling or weakness. PSYCHIATRIC: No depression, no loss of interest in normal activity or change in sleep pattern.     Exam: chaperone present  BP 110/76 mmHg  Ht '5\' 4"'  (1.626 m)  Wt 117 lb (53.071 kg)  BMI 20.07 kg/m2  Body mass index is 20.07 kg/(m^2).  General appearance : Well developed well nourished female. No acute distress HEENT: Eyes: no retinal hemorrhage or exudates,  Neck supple, trachea midline, no carotid bruits, no thyroidmegaly Lungs: Clear to auscultation, no rhonchi or wheezes, or rib retractions  Heart: Regular rate  and rhythm, no murmurs or gallops Breast:Examined in sitting and supine position were symmetrical in appearance, no palpable masses or tenderness,  no skin retraction, no nipple inversion, no nipple discharge, no skin discoloration, no axillary or supraclavicular lymphadenopathy Abdomen: no palpable masses or tenderness, no rebound or guarding Extremities: no edema or skin discoloration or tenderness  Pelvic:  Bartholin, Urethra, Skene Glands: Within normal limits             Vagina: No gross lesions or discharge, vaginal atrophy  Cervix: No gross lesions or discharge  Uterus  anteverted, normal size, shape and consistency, non-tender and mobile  Adnexa  Without masses or  tenderness  Anus and perineum  normal   Rectovaginal  normal sphincter tone without palpated masses or tenderness             Hemoccult PCP will provide   Wet prep negative  Assessment/Plan:  54 y.o. female for annual exam with evidence of vaginal atrophy. She is going to be prescribed Vagifem 10 g to apply intravaginally twice a week. Although the wet prep did not demonstrate any yeast on the external genitalia I have recommended that she by over-the-counter Monistat to apply externally daily at bedtime for one week. If this becomes a recurrent issue we may want to change from the Vagifem vaginal tablets or vaginal estrogen whereby she can apply it not only inside the vagina but the external genitalia twice a week. Her PCP will be drawn her blood work. Her Pap smear was done today. She will be schedule a bone density study here in the office in the next few weeks. We discussed that when she has her mammogram in October to request for three-dimensional mammogram. We discussed importance of monthly self breast examination. We discussed importance of calcium vitamin D and regular exercise for osteoporosis prevention. Prescription refill for 2% testosterone cream to apply periclitoral a when necessary was provided as well .   Terrance Mass MD, 4:05 PM 07/20/2014

## 2014-07-25 LAB — CYTOLOGY - PAP

## 2014-10-10 ENCOUNTER — Ambulatory Visit (INDEPENDENT_AMBULATORY_CARE_PROVIDER_SITE_OTHER): Payer: 59

## 2014-10-10 DIAGNOSIS — Z780 Asymptomatic menopausal state: Secondary | ICD-10-CM | POA: Diagnosis not present

## 2014-10-10 DIAGNOSIS — Z8639 Personal history of other endocrine, nutritional and metabolic disease: Secondary | ICD-10-CM | POA: Diagnosis not present

## 2014-10-10 DIAGNOSIS — M8580 Other specified disorders of bone density and structure, unspecified site: Secondary | ICD-10-CM

## 2014-10-12 ENCOUNTER — Other Ambulatory Visit: Payer: Self-pay | Admitting: *Deleted

## 2014-10-12 DIAGNOSIS — M8580 Other specified disorders of bone density and structure, unspecified site: Secondary | ICD-10-CM

## 2014-10-23 ENCOUNTER — Ambulatory Visit
Admission: RE | Admit: 2014-10-23 | Discharge: 2014-10-23 | Disposition: A | Discharge status: Home / Self Care | Payer: 59 | Source: Ambulatory Visit | Attending: Family Medicine | Admitting: Family Medicine

## 2014-10-23 ENCOUNTER — Other Ambulatory Visit: Payer: 59

## 2014-10-23 ENCOUNTER — Other Ambulatory Visit: Payer: Self-pay | Admitting: Family Medicine

## 2014-10-23 DIAGNOSIS — T148XXA Other injury of unspecified body region, initial encounter: Secondary | ICD-10-CM

## 2014-10-23 DIAGNOSIS — M8580 Other specified disorders of bone density and structure, unspecified site: Secondary | ICD-10-CM

## 2014-10-24 LAB — PTH, INTACT AND CALCIUM
Calcium: 9.5 mg/dL (ref 8.4–10.5)
PTH: 15 pg/mL (ref 14–64)

## 2014-10-24 LAB — VITAMIN D 25 HYDROXY (VIT D DEFICIENCY, FRACTURES): VIT D 25 HYDROXY: 51 ng/mL (ref 30–100)

## 2014-12-27 ENCOUNTER — Encounter: Payer: Self-pay | Admitting: Gynecology

## 2014-12-27 ENCOUNTER — Other Ambulatory Visit: Payer: Self-pay | Admitting: Neurology

## 2014-12-28 ENCOUNTER — Other Ambulatory Visit: Payer: Self-pay

## 2014-12-28 MED ORDER — CLONAZEPAM 0.5 MG PO TABS: 0.5000 mg | ORAL_TABLET | Freq: Every day | ORAL | Status: DC

## 2014-12-28 NOTE — Telephone Encounter (Signed)
Rx signed and faxed.

## 2015-01-04 ENCOUNTER — Telehealth: Payer: Self-pay | Admitting: *Deleted

## 2015-01-04 MED ORDER — NONFORMULARY OR COMPOUNDED ITEM: Status: DC

## 2015-01-04 NOTE — Telephone Encounter (Signed)
Pt called because Rx was not sent from [facility] 07/20/14 Testosterone 2% Cream, I Called Rx for [facility]. Pt aware

## 2015-04-18 ENCOUNTER — Encounter: Payer: Self-pay | Admitting: Gynecology

## 2015-05-08 ENCOUNTER — Encounter: Payer: Self-pay | Admitting: Gynecology

## 2015-07-23 ENCOUNTER — Ambulatory Visit (INDEPENDENT_AMBULATORY_CARE_PROVIDER_SITE_OTHER): Payer: PRIVATE HEALTH INSURANCE | Admitting: Women's Health

## 2015-07-23 ENCOUNTER — Encounter: Payer: Self-pay | Admitting: Women's Health

## 2015-07-23 VITALS — BP 116/80 | Ht 64.0 in | Wt 118.0 lb

## 2015-07-23 DIAGNOSIS — Z833 Family history of diabetes mellitus: Secondary | ICD-10-CM | POA: Diagnosis not present

## 2015-07-23 DIAGNOSIS — N952 Postmenopausal atrophic vaginitis: Secondary | ICD-10-CM

## 2015-07-23 DIAGNOSIS — Z13220 Encounter for screening for lipoid disorders: Secondary | ICD-10-CM | POA: Diagnosis not present

## 2015-07-23 DIAGNOSIS — Z01419 Encounter for gynecological examination (general) (routine) without abnormal findings: Secondary | ICD-10-CM

## 2015-07-23 DIAGNOSIS — Z1329 Encounter for screening for other suspected endocrine disorder: Secondary | ICD-10-CM

## 2015-07-23 LAB — CBC WITH DIFFERENTIAL/PLATELET
BASOS PCT: 0 % (ref 0–1)
Basophils Absolute: 0 10*3/uL (ref 0.0–0.1)
EOS ABS: 0.1 10*3/uL (ref 0.0–0.7)
Eosinophils Relative: 2 % (ref 0–5)
HCT: 37.6 % (ref 36.0–46.0)
Hemoglobin: 12.9 g/dL (ref 12.0–15.0)
Lymphocytes Relative: 24 % (ref 12–46)
Lymphs Abs: 1.3 10*3/uL (ref 0.7–4.0)
MCH: 30.8 pg (ref 26.0–34.0)
MCHC: 34.3 g/dL (ref 30.0–36.0)
MCV: 89.7 fL (ref 78.0–100.0)
MONOS PCT: 8 % (ref 3–12)
MPV: 9.7 fL (ref 8.6–12.4)
Monocytes Absolute: 0.4 10*3/uL (ref 0.1–1.0)
NEUTROS PCT: 66 % (ref 43–77)
Neutro Abs: 3.7 10*3/uL (ref 1.7–7.7)
PLATELETS: 188 10*3/uL (ref 150–400)
RBC: 4.19 MIL/uL (ref 3.87–5.11)
RDW: 13.2 % (ref 11.5–15.5)
WBC: 5.6 10*3/uL (ref 4.0–10.5)

## 2015-07-23 LAB — URINALYSIS W MICROSCOPIC + REFLEX CULTURE
BILIRUBIN URINE: NEGATIVE
Casts: NONE SEEN [LPF]
Glucose, UA: NEGATIVE
KETONES UR: NEGATIVE
Leukocytes, UA: NEGATIVE
Nitrite: NEGATIVE
PH: 7 (ref 5.0–8.0)
PROTEIN: NEGATIVE
Specific Gravity, Urine: 1.02 (ref 1.001–1.035)
WBC, UA: NONE SEEN WBC/HPF (ref <=5)
Yeast: NONE SEEN [HPF]

## 2015-07-23 MED ORDER — ESTRADIOL 2 MG VA RING: 2.0000 mg | VAGINAL_RING | VAGINAL | Status: DC

## 2015-07-23 NOTE — Patient Instructions (Signed)
Menopause is a normal process in which your reproductive ability comes to an end. This process happens gradually over a span of months to years, usually between the ages of 48 and 55. Menopause is complete when you have missed 12 consecutive menstrual periods. It is important to talk with your health care provider about some of the most common conditions that affect postmenopausal women, such as heart disease, cancer, and bone loss (osteoporosis). Adopting a healthy lifestyle and getting preventive care can help to promote your health and wellness. Those actions can also lower your chances of developing some of these common conditions. WHAT SHOULD I KNOW ABOUT MENOPAUSE? During menopause, you may experience a number of symptoms, such as:  Moderate-to-severe hot flashes.  Night sweats.  Decrease in sex drive.  Mood swings.  Headaches.  Tiredness.  Irritability.  Memory problems.  Insomnia. Choosing to treat or not to treat menopausal changes is an individual decision that you make with your health care provider. WHAT SHOULD I KNOW ABOUT HORMONE REPLACEMENT THERAPY AND SUPPLEMENTS? Hormone therapy products are effective for treating symptoms that are associated with menopause, such as hot flashes and night sweats. Hormone replacement carries certain risks, especially as you become older. If you are thinking about using estrogen or estrogen with progestin treatments, discuss the benefits and risks with your health care provider. WHAT SHOULD I KNOW ABOUT HEART DISEASE AND STROKE? Heart disease, heart attack, and stroke become more likely as you age. This may be due, in part, to the hormonal changes that your body experiences during menopause. These can affect how your body processes dietary fats, triglycerides, and cholesterol. Heart attack and stroke are both medical emergencies. There are many things that you can do to help prevent heart disease and stroke:  Have your blood pressure  checked at least every 1-2 years. High blood pressure causes heart disease and increases the risk of stroke.  If you are 55-79 years old, ask your health care provider if you should take aspirin to prevent a heart attack or a stroke.  Do not use any tobacco products, including cigarettes, chewing tobacco, or electronic cigarettes. If you need help quitting, ask your health care provider.  It is important to eat a healthy diet and maintain a healthy weight.  Be sure to include plenty of vegetables, fruits, low-fat dairy products, and lean protein.  Avoid eating foods that are high in solid fats, added sugars, or salt (sodium).  Get regular exercise. This is one of the most important things that you can do for your health.  Try to exercise for at least 150 minutes each week. The type of exercise that you do should increase your heart rate and make you sweat. This is known as moderate-intensity exercise.  Try to do strengthening exercises at least twice each week. Do these in addition to the moderate-intensity exercise.  Know your numbers.Ask your health care provider to check your cholesterol and your blood glucose. Continue to have your blood tested as directed by your health care provider. WHAT SHOULD I KNOW ABOUT CANCER SCREENING? There are several types of cancer. Take the following steps to reduce your risk and to catch any cancer development as early as possible. Breast Cancer  Practice breast self-awareness.  This means understanding how your breasts normally appear and feel.  It also means doing regular breast self-exams. Let your health care provider know about any changes, no matter how small.  If you are 40 or older, have a clinician do a   breast exam (clinical breast exam or CBE) every year. Depending on your age, family history, and medical history, it may be recommended that you also have a yearly breast X-ray (mammogram).  If you have a family history of breast cancer,  talk with your health care provider about genetic screening.  If you are at high risk for breast cancer, talk with your health care provider about having an MRI and a mammogram every year.  Breast cancer (BRCA) gene test is recommended for women who have family members with BRCA-related cancers. Results of the assessment will determine the need for genetic counseling and BRCA1 and for BRCA2 testing. BRCA-related cancers include these types:  Breast. This occurs in males or females.  Ovarian.  Tubal. This may also be called fallopian tube cancer.  Cancer of the abdominal or pelvic lining (peritoneal cancer).  Prostate.  Pancreatic. Cervical, Uterine, and Ovarian Cancer Your health care provider may recommend that you be screened regularly for cancer of the pelvic organs. These include your ovaries, uterus, and vagina. This screening involves a pelvic exam, which includes checking for microscopic changes to the surface of your cervix (Pap test).  For women ages 21-65, health care providers may recommend a pelvic exam and a Pap test every three years. For women ages 77-65, they may recommend the Pap test and pelvic exam, combined with testing for human papilloma virus (HPV), every five years. Some types of HPV increase your risk of cervical cancer. Testing for HPV may also be done on women of any age who have unclear Pap test results.  Other health care providers may not recommend any screening for nonpregnant women who are considered low risk for pelvic cancer and have no symptoms. Ask your health care provider if a screening pelvic exam is right for you.  If you have had past treatment for cervical cancer or a condition that could lead to cancer, you need Pap tests and screening for cancer for at least 20 years after your treatment. If Pap tests have been discontinued for you, your risk factors (such as having a new sexual partner) need to be reassessed to determine if you should start having  screenings again. Some women have medical problems that increase the chance of getting cervical cancer. In these cases, your health care provider may recommend that you have screening and Pap tests more often.  If you have a family history of uterine cancer or ovarian cancer, talk with your health care provider about genetic screening.  If you have vaginal bleeding after reaching menopause, tell your health care provider.  There are currently no reliable tests available to screen for ovarian cancer. Lung Cancer Lung cancer screening is recommended for adults 3-70 years old who are at high risk for lung cancer because of a history of smoking. A yearly low-dose CT scan of the lungs is recommended if you:  Currently smoke.  Have a history of at least 30 pack-years of smoking and you currently smoke or have quit within the past 15 years. A pack-year is smoking an average of one pack of cigarettes per day for one year. Yearly screening should:  Continue until it has been 15 years since you quit.  Stop if you develop a health problem that would prevent you from having lung cancer treatment. Colorectal Cancer  This type of cancer can be detected and can often be prevented.  Routine colorectal cancer screening usually begins at age 38 and continues through age 12.  If you have  risk factors for colon cancer, your health care provider may recommend that you be screened at an earlier age.  If you have a family history of colorectal cancer, talk with your health care provider about genetic screening.  Your health care provider may also recommend using home test kits to check for hidden blood in your stool.  A small camera at the end of a tube can be used to examine your colon directly (sigmoidoscopy or colonoscopy). This is done to check for the earliest forms of colorectal cancer.  Direct examination of the colon should be repeated every 5-10 years until age 67. However, if early forms of  precancerous polyps or small growths are found or if you have a family history or genetic risk for colorectal cancer, you may need to be screened more often. Skin Cancer  Check your skin from head to toe regularly.  Monitor any moles. Be sure to tell your health care provider:  About any new moles or changes in moles, especially if there is a change in a mole's shape or color.  If you have a mole that is larger than the size of a pencil eraser.  If any of your family members has a history of skin cancer, especially at a young age, talk with your health care provider about genetic screening.  Always use sunscreen. Apply sunscreen liberally and repeatedly throughout the day.  Whenever you are outside, protect yourself by wearing long sleeves, pants, a wide-brimmed hat, and sunglasses. WHAT SHOULD I KNOW ABOUT OSTEOPOROSIS? Osteoporosis is a condition in which bone destruction happens more quickly than new bone creation. After menopause, you may be at an increased risk for osteoporosis. To help prevent osteoporosis or the bone fractures that can happen because of osteoporosis, the following is recommended:  If you are 39-61 years old, get at least 1,000 mg of calcium and at least 600 mg of vitamin D per day.  If you are older than age 16 but younger than age 7, get at least 1,200 mg of calcium and at least 600 mg of vitamin D per day.  If you are older than age 47, get at least 1,200 mg of calcium and at least 800 mg of vitamin D per day. Smoking and excessive alcohol intake increase the risk of osteoporosis. Eat foods that are rich in calcium and vitamin D, and do weight-bearing exercises several times each week as directed by your health care provider. WHAT SHOULD I KNOW ABOUT HOW MENOPAUSE AFFECTS [place]? Depression may occur at any age, but it is more common as you become older. Common symptoms of depression include:  Low or sad mood.  Changes in sleep patterns.  Changes  in appetite or eating patterns.  Feeling an overall lack of motivation or enjoyment of activities that you previously enjoyed.  Frequent crying spells. Talk with your health care provider if you think that you are experiencing depression. WHAT SHOULD I KNOW ABOUT IMMUNIZATIONS? It is important that you get and maintain your immunizations. These include:  Tetanus, diphtheria, and pertussis (Tdap) booster vaccine.  Influenza every year before the flu season begins.  Pneumonia vaccine.  Shingles vaccine. Your health care provider may also recommend other immunizations.   This information is not intended to replace advice given to you by your health care provider. Make sure you discuss any questions you have with your health care provider.   Document Released: 06/06/2005 Document Revised: 05/05/2014 Document Reviewed: 12/15/2013 Elsevier Interactive Patient Education 2016 Elsevier  Inc. Syncope Syncope is a medical term for fainting or passing out. This means you lose consciousness and drop to the ground. People are generally unconscious for less than 5 minutes. You may have some muscle twitches for up to 15 seconds before waking up and returning to normal. Syncope occurs more often in older adults, but it can happen to anyone. While most causes of syncope are not dangerous, syncope can be a sign of a serious medical problem. It is important to seek medical care.  CAUSES  Syncope is caused by a sudden drop in blood flow to the brain. The specific cause is often not determined. Factors that can bring on syncope include:  Taking medicines that lower blood pressure.  Sudden changes in posture, such as standing up quickly.  Taking more medicine than prescribed.  Standing in one place for too long.  Seizure disorders.  Dehydration and excessive exposure to heat.  Low blood sugar (hypoglycemia).  Straining to have a bowel movement.  Heart disease, irregular heartbeat, or other  circulatory problems.  Fear, emotional distress, seeing blood, or severe pain. SYMPTOMS  Right before fainting, you may:  Feel dizzy or light-headed.  Feel nauseous.  See all white or all black in your field of vision.  Have cold, clammy skin. DIAGNOSIS  Your health care provider will ask about your symptoms, perform a physical exam, and perform an electrocardiogram (ECG) to record the electrical activity of your heart. Your health care provider may also perform other heart or blood tests to determine the cause of your syncope which may include:  Transthoracic echocardiogram (TTE). During echocardiography, sound waves are used to evaluate how blood flows through your heart.  Transesophageal echocardiogram (TEE).  Cardiac monitoring. This allows your health care provider to monitor your heart rate and rhythm in real time.  Holter monitor. This is a portable device that records your heartbeat and can help diagnose heart arrhythmias. It allows your health care provider to track your heart activity for several days, if needed.  Stress tests by exercise or by giving medicine that makes the heart beat faster. TREATMENT  In most cases, no treatment is needed. Depending on the cause of your syncope, your health care provider may recommend changing or stopping some of your medicines. HOME CARE INSTRUCTIONS  Have someone stay with you until you feel stable.  Do not drive, use machinery, or play sports until your health care provider says it is okay.  Keep all follow-up appointments as directed by your health care provider.  Lie down right away if you start feeling like you might faint. Breathe deeply and steadily. Wait until all the symptoms have passed.  Drink enough fluids to keep your urine clear or pale yellow.  If you are taking blood pressure or heart medicine, get up slowly and take several minutes to sit and then stand. This can reduce dizziness. SEEK IMMEDIATE MEDICAL CARE IF:     You have a severe headache.  You have unusual pain in the chest, abdomen, or back.  You are bleeding from your mouth or rectum, or you have black or tarry stool.  You have an irregular or very fast heartbeat.  You have pain with breathing.  You have repeated fainting or seizure-like jerking during an episode.  You faint when sitting or lying down.  You have confusion.  You have trouble walking.  You have severe weakness.  You have vision problems. If you fainted, call your local emergency services (911 in [place]  Do not drive yourself to the hospital.    This information is not intended to replace advice given to you by your health care provider. Make sure you discuss any questions you have with your health care provider.   Document Released: 04/14/2005 Document Revised: 08/29/2014 Document Reviewed: 06/13/2011 Elsevier Interactive Patient Education [employer].

## 2015-07-23 NOTE — Progress Notes (Signed)
Sarah Mclean 1960/11/04 [id number]    History:    Presents for annual exam.  Postmenopausal/no bleeding occasional testosterone 2% cream. Reports low libido, vaginal dryness. Normal Pap and mammogram history.  2016 -2.3 femoral neck FRAX 4.7%/0.5%. 2014 negative colonoscopy. Sister colon cancer age 53, father sister breast cancer BRCA negative.   Past medical history, past surgical history, family history and social history were all reviewed and documented in the EPIC chart. Preschool teacher. Daughter  graduated from [employer], son at [employer] doing well and has a 59 year old adopted son from [place]. Mother sarcoma.  ROS:  A ROS was performed and pertinent positives and negatives are included.  Exam:  Filed Vitals:   07/23/15 1402  BP: 116/80    General appearance:  Normal Thyroid:  Symmetrical, normal in size, without palpable masses or nodularity. Respiratory  Auscultation:  Clear without wheezing or rhonchi Cardiovascular  Auscultation:  Regular rate, without rubs, murmurs or gallops  Edema/varicosities:  Not grossly evident Abdominal  Soft,nontender, without masses, guarding or rebound.  Liver/spleen:  No organomegaly noted  Hernia:  None appreciated  Skin  Inspection:  Grossly normal   Breasts: Examined lying and sitting.     Right: Without masses, retractions, discharge or axillary adenopathy.     Left: Without masses, retractions, discharge or axillary adenopathy. Gentitourinary   Inguinal/mons:  Normal without inguinal adenopathy  External genitalia:  Normal  BUS/Urethra/Skene's glands:  Normal  Vagina:  Atrophic  Cervix:  Normal  Uterus:   normal in size, shape and contour.  Midline and mobile  Adnexa/parametria:     Rt: Without masses or tenderness.   Lt: Without masses or tenderness.  Anus and perineum: Normal  Digital rectal exam: Normal sphincter tone without palpated masses or tenderness  Assessment/Plan:  55 y.o. MWF G6 P2 +1 adopted  for annual exam  with complaint of vertigo without loss of consciousness, questionable vagal response, hoarseness with a feeling of having to force air to speak over the past 6 months.  Postmenopausal with no bleeding with decreased libido and vaginal dryness Change in voice pattern Vertigo/ questionable vagal response Osteopenia without elevated FRAX  Plan: We'll get referral to diagnosed throat to evaluate changes. SBE's, annual screening 3-D mammogram encouraged history of dense breasts. Continue healthy lifestyle of regular exercise, healthy diet, normal vitamin D level. Vaginal dryness options reviewed will try Estring 1 per vagina every 3 months, prescription, proper use, coupon card given. Reviewed importance of increasing date night, leisure activities. Home safety, fall prevention and importance of weightbearing exercise reviewed. CBC, lipid panel, CMP, hemoglobin A1c, UA, Pap normal with negative HR HPV 2016, new screening guidelines reviewed.  Huel Cote WHNP, 3:08 PM 07/23/2015

## 2015-07-24 ENCOUNTER — Other Ambulatory Visit: Payer: Self-pay | Admitting: Women's Health

## 2015-07-24 DIAGNOSIS — Z833 Family history of diabetes mellitus: Secondary | ICD-10-CM

## 2015-07-24 LAB — COMPREHENSIVE METABOLIC PANEL
ALT: 15 U/L (ref 6–29)
AST: 18 U/L (ref 10–35)
Albumin: 4.2 g/dL (ref 3.6–5.1)
Alkaline Phosphatase: 52 U/L (ref 33–130)
BILIRUBIN TOTAL: 1.3 mg/dL — AB (ref 0.2–1.2)
BUN: 24 mg/dL (ref 7–25)
CO2: 27 mmol/L (ref 20–31)
Calcium: 9.3 mg/dL (ref 8.6–10.4)
Chloride: 104 mmol/L (ref 98–110)
Creat: 0.75 mg/dL (ref 0.50–1.05)
GLUCOSE: 78 mg/dL (ref 65–99)
POTASSIUM: 4 mmol/L (ref 3.5–5.3)
SODIUM: 140 mmol/L (ref 135–146)
Total Protein: 6.4 g/dL (ref 6.1–8.1)

## 2015-07-24 LAB — LIPID PANEL
Cholesterol: 159 mg/dL (ref 125–200)
HDL: 59 mg/dL (ref >=46)
LDL Cholesterol: 78 mg/dL (ref <130)
Total CHOL/HDL Ratio: 2.7 Ratio (ref <=5.0)
Triglycerides: 112 mg/dL (ref <150)
VLDL: 22 mg/dL (ref <30)

## 2015-07-24 LAB — TSH: TSH: 0.62 m[IU]/L

## 2015-07-24 LAB — HEMOGLOBIN A1C
Hgb A1c MFr Bld: 5.8 % — ABNORMAL HIGH (ref <5.7)
Mean Plasma Glucose: 120 mg/dL

## 2015-07-25 ENCOUNTER — Telehealth: Payer: Self-pay | Admitting: *Deleted

## 2015-07-25 NOTE — Telephone Encounter (Signed)
-----   Message from Huel Cote, NP sent at 07/23/2015  3:19 PM EDT ----- Needs referral for Ear nose throat for changes in voice, states feels like she is forcing air out, voice is raspy. Also has had some vertigo without syncope. Questionable vagal response.

## 2015-07-25 NOTE — Telephone Encounter (Signed)
I tried to call Sarah Mclean to schedule appointment, they do not accept pt insurance, I called pt and left on her voicemail that she will need to contact her insurance company and find out a name of ENT MD and to call me back and I will schedule for her.

## 2015-07-26 ENCOUNTER — Encounter: Payer: 59 | Admitting: Gynecology

## 2015-07-26 LAB — URINE CULTURE
Colony Count: NO GROWTH
Organism ID, Bacteria: NO GROWTH

## 2015-08-22 ENCOUNTER — Ambulatory Visit: Payer: Self-pay | Admitting: Cardiology

## 2015-09-17 ENCOUNTER — Ambulatory Visit
Admission: RE | Admit: 2015-09-17 | Discharge: 2015-09-17 | Disposition: A | Discharge status: Home / Self Care | Payer: No Typology Code available for payment source | Source: Ambulatory Visit | Attending: Family Medicine | Admitting: Family Medicine

## 2015-09-17 ENCOUNTER — Other Ambulatory Visit: Payer: Self-pay | Admitting: Family Medicine

## 2015-09-17 DIAGNOSIS — J189 Pneumonia, unspecified organism: Secondary | ICD-10-CM

## 2015-10-13 ENCOUNTER — Other Ambulatory Visit: Payer: Self-pay | Admitting: Nurse Practitioner

## 2015-10-23 ENCOUNTER — Other Ambulatory Visit: Payer: Self-pay | Admitting: Nurse Practitioner

## 2015-10-24 NOTE — Telephone Encounter (Signed)
Rx printed, signed, faxed to pharmacy. 

## 2015-10-24 NOTE — Telephone Encounter (Signed)
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  Klonopin 0.5mg  po qhs.   Needs RV.  Last seen 04-2014

## 2015-12-12 ENCOUNTER — Ambulatory Visit (INDEPENDENT_AMBULATORY_CARE_PROVIDER_SITE_OTHER): Payer: Self-pay | Admitting: Women's Health

## 2015-12-12 ENCOUNTER — Encounter: Payer: Self-pay | Admitting: Women's Health

## 2015-12-12 VITALS — BP 110/80 | Ht 64.0 in | Wt 118.0 lb

## 2015-12-12 DIAGNOSIS — R350 Frequency of micturition: Secondary | ICD-10-CM

## 2015-12-12 DIAGNOSIS — N898 Other specified noninflammatory disorders of vagina: Secondary | ICD-10-CM

## 2015-12-12 DIAGNOSIS — N760 Acute vaginitis: Secondary | ICD-10-CM

## 2015-12-12 DIAGNOSIS — A499 Bacterial infection, unspecified: Secondary | ICD-10-CM

## 2015-12-12 DIAGNOSIS — B9689 Other specified bacterial agents as the cause of diseases classified elsewhere: Secondary | ICD-10-CM

## 2015-12-12 LAB — URINALYSIS W MICROSCOPIC + REFLEX CULTURE
BILIRUBIN URINE: NEGATIVE
CASTS: NONE SEEN [LPF]
CRYSTALS: NONE SEEN [HPF]
Glucose, UA: NEGATIVE
Ketones, ur: NEGATIVE
Leukocytes, UA: NEGATIVE
NITRITE: NEGATIVE
PROTEIN: NEGATIVE
SPECIFIC GRAVITY, URINE: 1.02 (ref 1.001–1.035)
Yeast: NONE SEEN [HPF]
pH: 6 (ref 5.0–8.0)

## 2015-12-12 LAB — WET PREP FOR TRICH, YEAST, CLUE
TRICH WET PREP: NONE SEEN
YEAST WET PREP: NONE SEEN

## 2015-12-12 MED ORDER — METRONIDAZOLE 500 MG PO TABS: 500.0000 mg | ORAL_TABLET | Freq: Two times a day (BID) | ORAL | 0 refills | Status: DC

## 2015-12-12 NOTE — Patient Instructions (Signed)

## 2015-12-12 NOTE — Progress Notes (Signed)
Presents with complaint of watery discharge with brown tint without odor or itching but a change. Postmenopausal on no daily HRT with no bleeding. Uses testosterone cream 1-2 times weekly and has used it several times weekly in the past few weeks. Denies urinary symptoms, abdominal pain or fever. Same partner.  Exam: Appears well. External genitalia within normal limits, speculum exam scant watery discharge with yellow tint, wet prep positive for few clues, TNTC bacteria. Bimanual no CMT or adnexal tenderness. UA: Trace blood, 0-5 WBCs, 0-2 RBCs, few bacteria.  Bacteria vaginosis  Plan: Flagyl 500 twice daily for 7 days, prescription, proper use, alcohol precautions reviewed. Instructed to call or return if continued problems, reviewed her call if any bleeding noted. Urine Culture pending.

## 2015-12-14 LAB — URINE CULTURE

## 2015-12-17 ENCOUNTER — Telehealth: Payer: Self-pay | Admitting: *Deleted

## 2015-12-17 NOTE — Telephone Encounter (Signed)
Pt called and left message in triage  to follow up from office visit regarding vaginal discharge. Pt still the same, no improvement, not finish with Rx yet. I called pt back and left on her voicemail you are out of the office today to continue medication and follow up after completing Rx, but I will send to you as well. Please advise

## 2015-12-19 NOTE — Telephone Encounter (Signed)
Message left

## 2015-12-20 ENCOUNTER — Other Ambulatory Visit: Payer: Self-pay | Admitting: Women's Health

## 2015-12-20 DIAGNOSIS — N950 Postmenopausal bleeding: Secondary | ICD-10-CM

## 2015-12-20 NOTE — Telephone Encounter (Signed)
Telephone call for follow-up states is continuing to have a brown discharge no relief after BV treated with Flagyl. Postmenopausal greater than 5 years. Schedule sonohysterogram with Dr. Toney Rakes.  Please schedule sonohysterogram

## 2015-12-25 ENCOUNTER — Other Ambulatory Visit: Payer: Self-pay | Admitting: Gynecology

## 2015-12-25 DIAGNOSIS — N939 Abnormal uterine and vaginal bleeding, unspecified: Secondary | ICD-10-CM

## 2015-12-28 ENCOUNTER — Telehealth: Payer: Self-pay

## 2015-12-28 ENCOUNTER — Telehealth: Payer: Self-pay | Admitting: *Deleted

## 2015-12-28 MED ORDER — DIAZEPAM 5 MG PO TABS: ORAL_TABLET | ORAL | 0 refills | Status: DC

## 2015-12-28 NOTE — Telephone Encounter (Signed)
I spoke with patient and told her I heard from ins co about the "prenote" they required and she has been approved for [facility].  Since she has met deductible it should pay 100%.  precert confimation [id number]. Copy to be scanned into chart.

## 2015-12-28 NOTE — Telephone Encounter (Signed)
We can call her and Valium 5 mg she can take one the night before the procedure and the morning when she comes for the procedure. Call in 5 tablets

## 2015-12-28 NOTE — Telephone Encounter (Signed)
Pt informed with the below note,Rx called in.  

## 2015-12-28 NOTE — Telephone Encounter (Signed)
Pt is scheduled for [facility] 01/02/16 very nervous about procedure and pain with procedure asked if she could have something to take ahead of time before getting to the office? Please advise

## 2016-01-01 ENCOUNTER — Telehealth: Payer: Self-pay | Admitting: *Deleted

## 2016-01-01 NOTE — Telephone Encounter (Signed)
Pt is scheduled for [facility] on tomorrow, brownish discharged has stopped. Pt asked if appointment still needed for tomorrow? 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 Please advise

## 2016-01-01 NOTE — Telephone Encounter (Signed)
Phone call, left message on cell to keep scheduled appointment for sonohysterogram. Continue antibiotics.

## 2016-01-02 ENCOUNTER — Ambulatory Visit (INDEPENDENT_AMBULATORY_CARE_PROVIDER_SITE_OTHER): Payer: PRIVATE HEALTH INSURANCE

## 2016-01-02 ENCOUNTER — Encounter: Payer: Self-pay | Admitting: Gynecology

## 2016-01-02 ENCOUNTER — Ambulatory Visit (INDEPENDENT_AMBULATORY_CARE_PROVIDER_SITE_OTHER): Payer: PRIVATE HEALTH INSURANCE | Admitting: Gynecology

## 2016-01-02 DIAGNOSIS — N950 Postmenopausal bleeding: Secondary | ICD-10-CM

## 2016-01-02 DIAGNOSIS — N939 Abnormal uterine and vaginal bleeding, unspecified: Secondary | ICD-10-CM

## 2016-01-02 NOTE — Progress Notes (Signed)
   Patient is a 55 year old that presented to the office today for sonohysterogram as a result of an isolated episode of brownish vaginal discharge. Patient had been treated for a suspected bacterial vaginosis back in August 2016. Patient on no hormone replacement therapy with the exception of occasional she'll apply some topical testosterone periclitoral region for libido. Her last Pap smear was normal in 2016.  Ultrasound/sono hysterogram today: Uterus measured 5.4 x 4.2 x 2.5 cm with endometrial stripe of 1.3 mm. Right ovary was normal. Left ovary thinwall echo-free follicle measured 8 x 7 mm. No fluid in the cul-de-sac. No adnexal masses were seen on either side. The cervix is then cleansed with Betadine solution a sound 2 tenaculum was placed on the anterior cervical lip which required cervical dilatation because of mild stenosis and the catheter was inserted sterilely inside the uterine cavity normal saline was instilled. No intracavitary defect was noted.  Assessment/plan: Isolated event of isolated vaginal brownish discharge also treated for BV. No further bleeding reported although she was placed on doxycycline by another provider for like infection recently and has had no further bleeding. I believe that this isolated event of bleeding was probably atrophic please and she is on no hormone replacement therapy and her endometrial stripe is less than 5 mm. She was reassured to report any additional abnormal bleeding. Because of the thinness of the endometrial stripe and no further bleeding and endometrial biopsy was not done today.

## 2016-01-29 ENCOUNTER — Telehealth: Payer: Self-pay | Admitting: *Deleted

## 2016-01-29 NOTE — Telephone Encounter (Signed)
Pt called and left message with questions, I called pt back and left message for her to call me back.

## 2016-01-30 NOTE — Telephone Encounter (Signed)
Pt left message c/o brownish discharge again as noted on recent office visit, I called pt back and received her voicemail and told her best to schedule office visit with provider.

## 2016-07-16 ENCOUNTER — Encounter: Payer: Self-pay | Admitting: Gynecology

## 2016-07-23 ENCOUNTER — Encounter: Payer: PRIVATE HEALTH INSURANCE | Admitting: Women's Health

## 2016-07-23 ENCOUNTER — Encounter: Payer: Self-pay | Admitting: Gynecology

## 2016-07-23 ENCOUNTER — Ambulatory Visit (INDEPENDENT_AMBULATORY_CARE_PROVIDER_SITE_OTHER): Payer: PRIVATE HEALTH INSURANCE | Admitting: Gynecology

## 2016-07-23 VITALS — BP 120/76 | Ht 64.0 in | Wt 120.0 lb

## 2016-07-23 DIAGNOSIS — N952 Postmenopausal atrophic vaginitis: Secondary | ICD-10-CM

## 2016-07-23 DIAGNOSIS — Z79890 Hormone replacement therapy (postmenopausal): Secondary | ICD-10-CM | POA: Diagnosis not present

## 2016-07-23 DIAGNOSIS — N941 Unspecified dyspareunia: Secondary | ICD-10-CM

## 2016-07-23 DIAGNOSIS — Z01411 Encounter for gynecological examination (general) (routine) with abnormal findings: Secondary | ICD-10-CM

## 2016-07-23 DIAGNOSIS — M8580 Other specified disorders of bone density and structure, unspecified site: Secondary | ICD-10-CM

## 2016-07-23 NOTE — Patient Instructions (Signed)
Bone Densitometry Bone densitometry is an imaging test that uses a special X-ray to measure the amount of calcium and other minerals in your bones (bone density). This test is also known as a bone mineral density test or dual-energy X-ray absorptiometry (DXA). The test can measure bone density at your hip and your spine. It is similar to having a regular X-ray. You may have this test to:  Diagnose a condition that causes weak or thin bones (osteoporosis).  Predict your risk of a broken bone (fracture).  Determine how well osteoporosis treatment is working. Tell a health care provider about:  Any allergies you have.  All medicines you are taking, including vitamins, herbs, eye drops, creams, and over-the-counter medicines.  Any problems you or family members have had with anesthetic medicines.  Any blood disorders you have.  Any surgeries you have had.  Any medical conditions you have.  Possibility of pregnancy.  Any other medical test you had within the previous 14 days that used contrast material. What are the risks? Generally, this is a safe procedure. However, problems can occur and may include the following:  This test exposes you to a very small amount of radiation.  The risks of radiation exposure may be greater to unborn children. What happens before the procedure?  Do not take any calcium supplements for 24 hours before having the test. You can otherwise eat and drink what you usually do.  Take off all metal jewelry, eyeglasses, dental appliances, and any other metal objects. What happens during the procedure?  You may lie on an exam table. There will be an X-ray generator below you and an imaging device above you.  Other devices, such as boxes or braces, may be used to position your body properly for the scan.  You will need to lie still while the machine slowly scans your body.  The images will show up on a computer monitor. What happens after the  procedure? You may need more testing at a later time. This information is not intended to replace advice given to you by your health care provider. Make sure you discuss any questions you have with your health care provider. Document Released: 05/06/2004 Document Revised: 09/20/2015 Document Reviewed: 09/22/2013 Elsevier Interactive Patient Education  2017 Elsevier Inc.  

## 2016-07-23 NOTE — Progress Notes (Signed)
Sarah Mclean 08/20/1960 [id number]   History:    56 y.o.  for annual gyn exam who was also complaining of discomfort and painful intercourse decrease in libido and vaginal dryness. She is on no hormone replacement therapy. She has no vasomotor symptoms. She was having breast tenderness for about 1 week several months ago. Been no recurrence of symptoms. Patient's last bone density study in 2016 demonstrated her lowest T score was at the right femoral neck with a value of -2.3 with statistically significant decrease in bone mineralization at the AP spine of -6.8% and left femoral neck of -5.8%. She had normal Frax analysis. She takes calcium vitamin D and exercise on a regular basis. She has had vitamin D deficiency in the past and she has told me that her PCP has recently done all her blood work including vitamin D level which was normal. Patient is in the process of getting a colonoscopy the fact she had recent rectal bleeding. Review of her record indicated she has had 2 prior colonoscopy the last one was 2014 which was normal.She has one sister who had breast cancer and was tested for the BRCA1 / BRCA2 gene and was negative. Another sister had colon cancer the age of 33. Patient with no past history of abnormal Pap smear. Her vaccines are up to date.   Past medical history,surgical history, family history and social history were all reviewed and documented in the EPIC chart.  Gynecologic History No LMP recorded. Patient is postmenopausal. Contraception: post menopausal status Last Pap: 2016. Results were: normal Last mammogram: 2018. Results were: normal  Obstetric History OB History  Gravida Para Term Preterm AB Living  _0 SAB TAB Ectopic Multiple Live Births  4            # Outcome Date GA Lbr Len/2nd Weight Sex Delivery Anes PTL Lv  6 SAB           5 SAB           4 SAB           3 SAB           2 Term           1 Term                ROS: A ROS was performed and  pertinent positives and negatives are included in the history.  GENERAL: No fevers or chills. HEENT: No change in vision, no earache, sore throat or sinus congestion. NECK: No pain or stiffness. CARDIOVASCULAR: No chest pain or pressure. No palpitations. PULMONARY: No shortness of breath, cough or wheeze. GASTROINTESTINAL: No abdominal pain, nausea, vomiting or diarrhea, melena or bright red blood per rectum. GENITOURINARY: No urinary frequency, urgency, hesitancy or dysuria. MUSCULOSKELETAL: No joint or muscle pain, no back pain, no recent trauma. DERMATOLOGIC: No rash, no itching, no lesions. ENDOCRINE: No polyuria, polydipsia, no heat or cold intolerance. No recent change in weight. HEMATOLOGICAL: No anemia or easy bruising or bleeding. NEUROLOGIC: No headache, seizures, numbness, tingling or weakness. PSYCHIATRIC: No depression, no loss of interest in normal activity or change in sleep pattern.     Exam: chaperone present  BP 120/76   Ht _1  (1.626 m)   Wt 120 lb (54.4 kg)   BMI 20.60 kg/m   Body mass index is 20.6 kg/m.  General appearance : Well developed well nourished female. No acute distress HEENT: Eyes: no  retinal hemorrhage or exudates,  Neck supple, trachea midline, no carotid bruits, no thyroidmegaly Lungs: Clear to auscultation, no rhonchi or wheezes, or rib retractions  Heart: Regular rate and rhythm, no murmurs or gallops Breast:Examined in sitting and supine position were symmetrical in appearance, no palpable masses or tenderness,  no skin retraction, no nipple inversion, no nipple discharge, no skin discoloration, no axillary or supraclavicular lymphadenopathy Abdomen: no palpable masses or tenderness, no rebound or guarding Extremities: no edema or skin discoloration or tenderness  Pelvic:  Bartholin, Urethra, Skene Glands: Within normal limits             Vagina: No gross lesions or discharge, atrophic changes  Cervix: No gross lesions or discharge  Uterus   anteverted, normal size, shape and consistency, non-tender and mobile  Adnexa  Without masses or tenderness  Anus and perineum  normal   Rectovaginal  not examined colonoscopy next few weeks             Hemoccult same as above     Assessment/Plan:  56 y.o. female for annual exam with osteopenia we'll schedule for bone density study in the next couple weeks. Recent vitamin D level by PCP reported to be normal. Patient with vaginal atrophy tripping to her dyspareunia and decreased libido. We discussed treatment options to include Vagifem 10 g intravaginally twice a week or Osphena  60 mg daily. Risk benefits and pros and cons of both medication were discussed. We had an extensive discussion of the women's health initiative study. We discussed the risk benefits and pros and cons of hormone replacement therapy to include DVT and pulmonary embolism. She would like to start first with the Vagifem 10 g intravaginally twice a week and monitor symptoms and decide later if she wants to proceed with the [place]. Pap smear not done today.  An additional 10 minutes was spent discussing postmenopausal vaginal atrophy and hormone replacement therapy and treatment for dyspareunia and vaginal atrophy.   Terrance Mass MD, 3:05 PM 07/23/2016

## 2016-09-10 ENCOUNTER — Encounter: Payer: Self-pay | Admitting: Gynecology

## 2016-10-21 ENCOUNTER — Other Ambulatory Visit: Payer: Self-pay | Admitting: Gynecology

## 2016-10-21 ENCOUNTER — Ambulatory Visit (INDEPENDENT_AMBULATORY_CARE_PROVIDER_SITE_OTHER): Payer: PRIVATE HEALTH INSURANCE

## 2016-10-21 DIAGNOSIS — Z13820 Encounter for screening for osteoporosis: Secondary | ICD-10-CM

## 2016-10-21 DIAGNOSIS — M8580 Other specified disorders of bone density and structure, unspecified site: Secondary | ICD-10-CM

## 2016-10-21 DIAGNOSIS — Z79890 Hormone replacement therapy (postmenopausal): Secondary | ICD-10-CM

## 2016-10-21 DIAGNOSIS — M810 Age-related osteoporosis without current pathological fracture: Secondary | ICD-10-CM | POA: Diagnosis not present

## 2016-10-23 ENCOUNTER — Other Ambulatory Visit: Payer: Self-pay | Admitting: *Deleted

## 2016-10-23 ENCOUNTER — Other Ambulatory Visit: Payer: PRIVATE HEALTH INSURANCE

## 2016-10-23 ENCOUNTER — Encounter: Payer: Self-pay | Admitting: *Deleted

## 2016-10-23 DIAGNOSIS — M818 Other osteoporosis without current pathological fracture: Secondary | ICD-10-CM

## 2016-10-25 LAB — VITAMIN D 25 HYDROXY (VIT D DEFICIENCY, FRACTURES): Vit D, 25-Hydroxy: 32 ng/mL (ref 30–100)

## 2016-10-27 ENCOUNTER — Ambulatory Visit (INDEPENDENT_AMBULATORY_CARE_PROVIDER_SITE_OTHER): Payer: PRIVATE HEALTH INSURANCE | Admitting: Gynecology

## 2016-10-27 ENCOUNTER — Encounter: Payer: Self-pay | Admitting: Gynecology

## 2016-10-27 VITALS — BP 120/78

## 2016-10-27 DIAGNOSIS — M810 Age-related osteoporosis without current pathological fracture: Secondary | ICD-10-CM | POA: Diagnosis not present

## 2016-10-27 LAB — PTH, INTACT AND CALCIUM
CALCIUM: 8.8 mg/dL (ref 8.6–10.4)
PTH: 29 pg/mL (ref 14–64)

## 2016-10-27 NOTE — Progress Notes (Signed)
Patient ID: Sarah Mclean, female   DOB: [birth year], 56 y.o.   MRN: [id number]      Patient is a 56 year old that presented to the office todaydiscuss her bone density study. She was seen the office for her annual exam on 07/23/2016 please refer her for additional information. Review of patient's record indicated in 2016 her bone density study demonstrated that the lowest T score was at the right femoral neck with a value of -2.3. She had a normal Frax analysis. She is currently on calcium and vitamin D and was a very active lifestyle and exercises on a regular basis. Patient does not smoke or consume alcoholic beverages. Mother had history of osteoporosis. Patient with no fractures at the age of 56 and no chronic usage of glucocorticoids.  The most recent bone density study done here in the office demonstrated her bone density study at the right femoral neck was now -2.5 in the osteoporotic range. When compared with 2010 which was her first bone density study she had a decreased bone mineralization of -7.4% total right hip and -13.1% left hip and AP spine of -10.17%. Based on these numbers patient bone mass is 25% below normal and hasn't 11 times greater risk of a hip fracture.  Patient's risk for fracture as follows: #1 thin weight less than 124 pounds #2 Caucasian #3 menopausal no hormone replacement therapy #4 mother with osteoporosis  Based on the above risk factor it is recommended that she begin to receive treatment. The following medications as well as risk were discussed with the patient:  The risk and benefits of oral bisphosphonate therapy were conveyed to the patient in today's visit. Benefits include a significant risk reduction of vertebral, hip and non vertebral fractures. We also discussed potential adverse effects to include precipitation or aggravation of GERD reflux disease as well as the risk of upper GI bleeding although this is reported in the literature to be extremely rare.  Other rare adverse effects that were discussed of patient taking oral bisphosphonate included a 1 in 10,000-10,000 risk for osteonecrosis of the jaw, and a risk for atypical femoral fractures of approximately 1 in 5000-10,000. Signs and symptoms of atypical femoral fracture were also discussed and the patient was informed to contact our office if any unusual symptoms were to develop.  The risk and benefits of IV bisphosphonate were discussed with the patient today to include a significant risk reduction for vertebral, hip and non-vertebral fractures. Potential risk of therapy were also discussed to include a 10-15% chance for a flulike reaction which may last 2-3 days after IV bisphosphonate. We also discussed that longer reactions lasting perhaps weeks to months have been rarely reported to the FDA following IV bisphosphonate treatment. This would also require symptomatic management. Other potential risk that were discussed included the following: Regular eye irritation, approximately 1 and 1000-10,000 risk for osteonecrosis of the jaw as well as a risk for atypical femoral fracture of approximately 1 in 5000-10,000 based on current data on oral bisphosphonate. Signs and symptoms of atypical femoral fracture were also discussed and the patient was asked to contact the office if she develops any of these symptoms.  The risk and benefits of Forteo were discussed with the patient today to include a significant risk reduction for vertebral and nontender vertebral fractures. The most common side effects that have been reported include: Dizziness, and leg cramps although generally not severe enough to warrant discontinuation in the majority of patient on this medication. The black  box warning was discussed in depth, which is based on the fact that supra-pharmacological doses of Forteo did increase the risk for benign and malignant bone tumors in rats, though there has not been an observed increase incidence in humans  to date, based on over a decade of clinical experience. Patient does not have any relative contraindication to Forteo, including no history of previous therapeutic radiation therapy, active neoplasms involving the skeleton or Paget's disease.  The risk and benefits of Evista were discussed with the patient today, including a significant risk reduction for vertebral fractures. Other potential risks discussed with the patient include the most common reported adverse effect which are flushes and leg cramps. We also discussed the risk for DVT (deep venous thrombosis) of approximately 1 in 400, similar to that seen in patients taking oral estrogens. The fact that there also may be an increased risk for fatal stroke in individuals with a history of cardiovascular disease and or risk factors of heart attack and stroke based on the RUTH trial was also discussed with the patient.  The risk and benefits of Prolia were discussed with the patient today to include a significant risk reduction for vertebral, hip and non-vertebral fractures. Potential risk also discussed were skin advanced to include rash, pruritus, eczema and other skin reactions. The recurrence of infections and clinical studies with PROLIA, including serious infections were also discussed with the patient. In 3 year clinical trial with Prolia although six-year trial data does not show any evidence for any additional or accelerating risk of infection. Other risks that were discussed included a 1 in 1000-10,000 risk of osteonecrosis of the jaw based on current available data  I have recommended based on the region of interest and the femoral neck with osteoporosis that she will be a candidate for Actonel 150 mg once monthly or Prolia  60 mg subcutaneous every 6 months or Reclast IV infusion once a year. Patient's recent vitamin D level in the office was normal at 32 calcium and PTH pending at time of this dictation. Patient will read over the information  described above and will check with insurance for coverage. Have also recommended she would need a bone density study one year after initiating therapy to see response to medication.  Greater than 90% time was spent counseling and coordinating care for patient time of consultation 25 minutes

## 2016-10-27 NOTE — Patient Instructions (Signed)
Zoledronic Acid injection (Paget's Disease, Osteoporosis) What is this medicine? ZOLEDRONIC ACID (ZOE le dron ik AS id) lowers the amount of calcium loss from bone. It is used to treat Paget's disease and osteoporosis in women. This medicine may be used for other purposes; ask your health care provider or pharmacist if you have questions. COMMON BRAND NAME(S): Reclast, Zometa What should I tell my health care provider before I take this medicine? They need to know if you have any of these conditions: -aspirin-sensitive asthma -cancer, especially if you are receiving medicines used to treat cancer -dental disease or wear dentures -infection -kidney disease -low levels of calcium in the blood -past surgery on the parathyroid gland or intestines -receiving corticosteroids like dexamethasone or prednisone -an unusual or allergic reaction to zoledronic acid, other medicines, foods, dyes, or preservatives -pregnant or trying to get pregnant -breast-feeding How should I use this medicine? This medicine is for infusion into a vein. It is given by a health care professional in a hospital or clinic setting. Talk to your pediatrician regarding the use of this medicine in children. This medicine is not approved for use in children. Overdosage: If you think you have taken too much of this medicine contact a poison control center or emergency room at once. NOTE: This medicine is only for you. Do not share this medicine with others. What if I miss a dose? It is important not to miss your dose. Call your doctor or health care professional if you are unable to keep an appointment. What may interact with this medicine? -certain antibiotics given by injection -NSAIDs, medicines for pain and inflammation, like ibuprofen or naproxen -some diuretics like bumetanide, furosemide -teriparatide This list may not describe all possible interactions. Give your health care provider a list of all the medicines,  herbs, non-prescription drugs, or dietary supplements you use. Also tell them if you smoke, drink alcohol, or use illegal drugs. Some items may interact with your medicine. What should I watch for while using this medicine? Visit your doctor or health care professional for regular checkups. It may be some time before you see the benefit from this medicine. Do not stop taking your medicine unless your doctor tells you to. Your doctor may order blood tests or other tests to see how you are doing. Women should inform their doctor if they wish to become pregnant or think they might be pregnant. There is a potential for serious side effects to an unborn child. Talk to your health care professional or pharmacist for more information. You should make sure that you get enough calcium and vitamin D while you are taking this medicine. Discuss the foods you eat and the vitamins you take with your health care professional. Some people who take this medicine have severe bone, joint, and/or muscle pain. This medicine may also increase your risk for jaw problems or a broken thigh bone. Tell your doctor right away if you have severe pain in your jaw, bones, joints, or muscles. Tell your doctor if you have any pain that does not go away or that gets worse. Tell your dentist and dental surgeon that you are taking this medicine. You should not have major dental surgery while on this medicine. See your dentist to have a dental exam and fix any dental problems before starting this medicine. Take good care of your teeth while on this medicine. Make sure you see your dentist for regular follow-up appointments. What side effects may I notice from receiving this medicine?  Side effects that you should report to your doctor or health care professional as soon as possible: -allergic reactions like skin rash, itching or hives, swelling of the face, lips, or tongue -anxiety, confusion, or depression -breathing problems -changes in  vision -eye pain -feeling faint or lightheaded, falls -jaw pain, especially after dental work -mouth sores -muscle cramps, stiffness, or weakness -redness, blistering, peeling or loosening of the skin, including inside the mouth -trouble passing urine or change in the amount of urine Side effects that usually do not require medical attention (report to your doctor or health care professional if they continue or are bothersome): -bone, joint, or muscle pain -constipation -diarrhea -fever -hair loss -irritation at site where injected -loss of appetite -nausea, vomiting -stomach upset -trouble sleeping -trouble swallowing -weak or tired This list may not describe all possible side effects. Call your doctor for medical advice about side effects. You may report side effects to FDA at 1-800-FDA-1088. Where should I keep my medicine? This drug is given in a hospital or clinic and will not be stored at home. NOTE: This sheet is a summary. It may not cover all possible information. If you have questions about this medicine, talk to your doctor, pharmacist, or health care provider.  2018 Elsevier/Gold Standard (2013-09-10 14:19:57) Denosumab injection What is this medicine? DENOSUMAB (den oh sue mab) slows bone breakdown. Prolia is used to treat osteoporosis in women after menopause and in men. Delton See is used to treat a high calcium level due to cancer and to prevent bone fractures and other bone problems caused by multiple myeloma or cancer bone metastases. Delton See is also used to treat giant cell tumor of the bone. This medicine may be used for other purposes; ask your health care provider or pharmacist if you have questions. COMMON BRAND NAME(S): Prolia, XGEVA What should I tell my health care provider before I take this medicine? They need to know if you have any of these conditions: -dental disease -having surgery or tooth extraction -infection -kidney disease -low levels of calcium or  Vitamin D in the blood -malnutrition -on hemodialysis -skin conditions or sensitivity -thyroid or parathyroid disease -an unusual reaction to denosumab, other medicines, foods, dyes, or preservatives -pregnant or trying to get pregnant -breast-feeding How should I use this medicine? This medicine is for injection under the skin. It is given by a health care professional in a hospital or clinic setting. If you are getting Prolia, a special MedGuide will be given to you by the pharmacist with each prescription and refill. Be sure to read this information carefully each time. For Prolia, talk to your pediatrician regarding the use of this medicine in children. Special care may be needed. For Delton See, talk to your pediatrician regarding the use of this medicine in children. While this drug may be prescribed for children as young as 13 years for selected conditions, precautions do apply. Overdosage: If you think you have taken too much of this medicine contact a poison control center or emergency room at once. NOTE: This medicine is only for you. Do not share this medicine with others. What if I miss a dose? It is important not to miss your dose. Call your doctor or health care professional if you are unable to keep an appointment. What may interact with this medicine? Do not take this medicine with any of the following medications: -other medicines containing denosumab This medicine may also interact with the following medications: -medicines that lower your chance of fighting infection -steroid  medicines like prednisone or cortisone This list may not describe all possible interactions. Give your health care provider a list of all the medicines, herbs, non-prescription drugs, or dietary supplements you use. Also tell them if you smoke, drink alcohol, or use illegal drugs. Some items may interact with your medicine. What should I watch for while using this medicine? Visit your doctor or health care  professional for regular checks on your progress. Your doctor or health care professional may order blood tests and other tests to see how you are doing. Call your doctor or health care professional for advice if you get a fever, chills or sore throat, or other symptoms of a cold or flu. Do not treat yourself. This drug may decrease your body's ability to fight infection. Try to avoid being around people who are sick. You should make sure you get enough calcium and vitamin D while you are taking this medicine, unless your doctor tells you not to. Discuss the foods you eat and the vitamins you take with your health care professional. See your dentist regularly. Brush and floss your teeth as directed. Before you have any dental work done, tell your dentist you are receiving this medicine. Do not become pregnant while taking this medicine or for 5 months after stopping it. Talk with your doctor or health care professional about your birth control options while taking this medicine. Women should inform their doctor if they wish to become pregnant or think they might be pregnant. There is a potential for serious side effects to an unborn child. Talk to your health care professional or pharmacist for more information. What side effects may I notice from receiving this medicine? Side effects that you should report to your doctor or health care professional as soon as possible: -allergic reactions like skin rash, itching or hives, swelling of the face, lips, or tongue -bone pain -breathing problems -dizziness -jaw pain, especially after dental work -redness, blistering, peeling of the skin -signs and symptoms of infection like fever or chills; cough; sore throat; pain or trouble passing urine -signs of low calcium like fast heartbeat, muscle cramps or muscle pain; pain, tingling, numbness in the hands or feet; seizures -unusual bleeding or bruising -unusually weak or tired Side effects that usually do not  require medical attention (report to your doctor or health care professional if they continue or are bothersome): -constipation -diarrhea -headache -joint pain -loss of appetite -muscle pain -runny nose -tiredness -upset stomach This list may not describe all possible side effects. Call your doctor for medical advice about side effects. You may report side effects to FDA at 1-800-FDA-1088. Where should I keep my medicine? This medicine is only given in a clinic, doctor's office, or other health care setting and will not be stored at home. NOTE: This sheet is a summary. It may not cover all possible information. If you have questions about this medicine, talk to your doctor, pharmacist, or health care provider.  2018 Elsevier/Gold Standard (2016-05-06 19:17:21) Calcium Carbonate; Risedronate tablets What is this medicine? CALCIUM CARBONATE; RISEDRONATE (KAL see um KAR bon ate; ris ED roe nate) reduces calcium loss from bones. It is used to prevent and to treat osteoporosis. This medicine may be used for other purposes; ask your health care provider or pharmacist if you have questions. COMMON BRAND NAME(S): Actonel with Calcium What should I tell my health care provider before I take this medicine? They need to know if you have any of these conditions: -dental disease -esophageal,  stomach, or intestinal problems, like acid reflux or GERD -hyperparathyroidism -kidney stones or disease -low or high blood calcium -problems sitting or standing for 30 minutes -trouble swallowing -an unusual or allergic reaction to calcium, risedronate, other bisphosphonates or medicines, foods, dyes, or preservatives -pregnant or trying to get pregnant -breast-feeding How should I use this medicine? Follow the directions on the prescription label. Take risedronate on the same day of the week, every week. Take calcium on the other 6 days of the week. On the day you take the risedronate: You must take this  medicine exactly as directly or you will lower the amount of the medicine that you absorb into your body or you may cause yourself harm. Take this medicine by mouth first thing in the morning, after you are up for the day. Do not eat or drink anything before you take this medicine. Swallow the tablet with a full glass (6 to 8 ounces) of plain water. Do not take this medicine with any other drink. Do not chew or crush the tablet. After taking this medicine, do not eat breakfast, drink, or take any other medicines or vitamins for at least 30 minutes. Stand or sit up for at least 30 minutes after you take this medicine; do not lie down. Take this medicine on the same day every week. Do not take your medicine more often than directed. On the days you take the calcium: Take your calcium tablet with food on the 6 days of the week that you do not take risedronate. Do not take other medicines at the same time as your calcium. Talk to your pediatrician regarding the use of this medicine in children. Special care may be needed. Overdosage: If you think you have taken too much of this medicine contact a poison control center or emergency room at once. NOTE: This medicine is only for you. Do not share this medicine with others. What if I miss a dose? If you miss a dose of risedronate, take the dose on the morning after you remember. Then take your next dose on your regular scheduled day of the week. Never take 2 tablets on the same day. Do not take double or extra doses. If you miss a dose of calcium, take it as soon as you remember with your next meal. Do not take 2 tablets with the same meal. Do not take calcium and risedronate tablets at the same time. What may interact with this medicine? Do not take this medicine with any of the following medications: -ammonium chloride -methenamine This medicine may also interact with the following medications: -antacids like aluminum hydroxide or magnesium  hydroxide -antibiotics like ciprofloxacin, tetracycline and others -aspirin -calcium supplements -drugs for inflammation like ibuprofen, naproxen, and others -iron supplements -magnesium supplements -NSAIDs, medicines for pain and inflammation, like ibuprofen or naproxen -steroid medicines like prednisone or cortisone -thiazide diuretics -thyroid hormones -vitamins with minerals This list may not describe all possible interactions. Give your health care provider a list of all the medicines, herbs, non-prescription drugs, or dietary supplements you use. Also tell them if you smoke, drink alcohol, or use illegal drugs. Some items may interact with your medicine. What should I watch for while using this medicine? Visit your doctor or health care professional for regular check ups. It may be some time before you see the benefit from this medicine. Your doctor may order blood tests or other tests to see how you are doing. You should make sure that you get enough calcium  and vitamin D while you are taking this medicine. Discuss the foods you eat and the vitamins you take with your health care professional. Some people who take this medicine have severe bone, joint, and/or muscle pain. This medicine may also increase your risk for a broken thigh bone. Tell your doctor right away if you have pain in your upper leg or groin. Tell your doctor if you have any pain that does not go away or that gets worse. What side effects may I notice from receiving this medicine? Side effects that you should report to your doctor or health care professional as soon as possible: -allergic reactions like skin rash, itching or hives, swelling of the face, lips, or tongue -breathing problems -black or tarry stools -changes in vision -heartburn or stomach pain -jaw pain, especially after dental work -pain or difficulty when swallowing -redness, blistering, peeling, or loosening of the skin, including inside the  mouth -unusually weak or tired Side effects that usually do not require medical attention (report to your doctor or health care professional if they continue or are bothersome): -bone, muscle, or joint pain -changes in taste -diarrhea or constipation -eye pain or itching -headache -nausea or vomiting -stomach gas or fullness This list may not describe all possible side effects. Call your doctor for medical advice about side effects. You may report side effects to FDA at 1-800-FDA-1088. Where should I keep my medicine? Keep out of the reach of children. Store at room temperature between 15 and 30 degrees C (59 and 86 degrees F). Throw away any unused medicine after the expiration date. NOTE: This sheet is a summary. It may not cover all possible information. If you have questions about this medicine, talk to your doctor, pharmacist, or health care provider.  2018 Elsevier/Gold Standard (2015-05-17 10:24:50)

## 2016-10-28 ENCOUNTER — Encounter: Payer: Self-pay | Admitting: *Deleted

## 2016-12-09 ENCOUNTER — Telehealth: Payer: Self-pay | Admitting: Gynecology

## 2016-12-09 NOTE — Telephone Encounter (Signed)
PC to insurance [place] Prolia. Talked with rep and she stated that Prolia is NOT covered under the patient share plan and the injection cost will be the responsibility of the patient. I will call and leave message for patient. Pt does not have a current RX for actonel per office note from Dr Toney Rakes. She will need RX sent if she does decide to start medication.

## 2016-12-09 NOTE — Telephone Encounter (Signed)
PC to pt she understands and will check on Cost for Reclast and actonel. She will decide and call me back. She is changing MD to Dr Phineas Real.

## 2016-12-25 NOTE — Telephone Encounter (Signed)
PC to F/U Reclast vs Actonel

## 2017-01-01 NOTE — Telephone Encounter (Signed)
PC from PT unable to get approval for Reclast and Prolia. She will call back with her decision about oral meds.

## 2017-02-17 ENCOUNTER — Other Ambulatory Visit: Payer: Self-pay | Admitting: Gastroenterology

## 2017-02-17 DIAGNOSIS — R1084 Generalized abdominal pain: Secondary | ICD-10-CM

## 2017-02-25 ENCOUNTER — Ambulatory Visit
Admission: RE | Admit: 2017-02-25 | Discharge: 2017-02-25 | Disposition: A | Discharge status: Home / Self Care | Payer: No Typology Code available for payment source | Source: Ambulatory Visit | Attending: Gastroenterology | Admitting: Gastroenterology

## 2017-02-25 DIAGNOSIS — R1084 Generalized abdominal pain: Secondary | ICD-10-CM

## 2017-06-08 ENCOUNTER — Encounter: Payer: Self-pay | Admitting: Gynecology

## 2017-06-08 ENCOUNTER — Ambulatory Visit (INDEPENDENT_AMBULATORY_CARE_PROVIDER_SITE_OTHER): Payer: PRIVATE HEALTH INSURANCE | Admitting: Gynecology

## 2017-06-08 VITALS — BP 118/76

## 2017-06-08 DIAGNOSIS — M810 Age-related osteoporosis without current pathological fracture: Secondary | ICD-10-CM | POA: Diagnosis not present

## 2017-06-08 NOTE — Progress Notes (Signed)
    Sarah Mclean [birth year] [id number]        56 y.o.  [id number] presents to discuss her osteoporosis and treatment options.  Recent DEXA 09/2016 shows right femoral neck T score -2.5, left femoral neck T score -2.4 and AP spine T score -2.3.  She has met with Dr. Toney Rakes and discussed her situation and treatment options.  She had a normal TSH calcium and PTH.  Her vitamin D level was 32.  Currently taking 800 units vitamin D daily along with her extra calcium.  Is very active.  Past medical history,surgical history, problem list, medications, allergies, family history and social history were all reviewed and documented in the EPIC chart.  Directed ROS with pertinent positives and negatives documented in the history of present illness/assessment and plan.  Exam: Vitals:   06/08/17 1509  BP: 118/76   General appearance:  Normal   Assessment/Plan:  57 y.o. [id number] with osteoporosis at age 22.  Positive maternal history of osteoporosis.  Patient underwent menopause early in the mid 47s with no HRT.  Currently not having significant hot flushes or sweats.  We reviewed her current situation and risks particularly of future fractures as she ages.  She is very active and takes extra calcium and vitamin D.  I recommended we recheck a vitamin D level now as she was marginal previously she will go ahead and have that drawn today.  We extensively discussed treatment options to include the risks versus benefits of each treatment option as well as administration issues.  We discussed bisphosphate's such as alendronate, Actonel/Boniva and Reclast.  Risks to include GERD, osteonecrosis of the jaw and atypical fractures were reviewed.  We discussed Prolia with additional risks of infection and rash.  Evista with risks of thrombosis as well as decreased breast cancer risk benefit.  Do not feel Forteo appropriate given her young age, active lifestyle no history of fracture.  Patient wants to think of her options and  will follow-up with her decision.  She is thinking between alendronate and Prolia.  She understands with Prolia at the end of treatment course it would be recommended that she continue on some antiresorptive medications such as alendronate following this.  Alternative would be to stay on Prolia for a prolonged course.  If she started alendronate and we would plan on a 5-year course with follow-up bone density in 2 years.  How to take alendronate as far as nothing to eat or drink for an hour before and after as well as remaining upright was discussed the patient will follow-up with me with her decision.  Greater than 50% of my 25-minute office visit was spent in direct face to face counseling and coordination of care with the patient.   Anastasio Auerbach MD, 4:11 PM 06/08/2017

## 2017-06-08 NOTE — Patient Instructions (Signed)
Follow-up with your decision about osteoporosis treatment

## 2017-06-09 ENCOUNTER — Encounter: Payer: Self-pay | Admitting: Gynecology

## 2017-06-09 LAB — VITAMIN D 25 HYDROXY (VIT D DEFICIENCY, FRACTURES): VIT D 25 HYDROXY: 49 ng/mL (ref 30–100)

## 2017-07-24 ENCOUNTER — Encounter: Payer: PRIVATE HEALTH INSURANCE | Admitting: Gynecology

## 2017-07-24 ENCOUNTER — Encounter: Payer: PRIVATE HEALTH INSURANCE | Admitting: Obstetrics & Gynecology

## 2017-11-03 ENCOUNTER — Encounter: Payer: Self-pay | Admitting: Obstetrics & Gynecology

## 2017-11-03 ENCOUNTER — Ambulatory Visit (INDEPENDENT_AMBULATORY_CARE_PROVIDER_SITE_OTHER): Payer: PRIVATE HEALTH INSURANCE | Admitting: Obstetrics & Gynecology

## 2017-11-03 VITALS — BP 124/78 | Ht 64.0 in | Wt 118.0 lb

## 2017-11-03 DIAGNOSIS — Z780 Asymptomatic menopausal state: Secondary | ICD-10-CM

## 2017-11-03 DIAGNOSIS — N952 Postmenopausal atrophic vaginitis: Secondary | ICD-10-CM

## 2017-11-03 DIAGNOSIS — Z01419 Encounter for gynecological examination (general) (routine) without abnormal findings: Secondary | ICD-10-CM

## 2017-11-03 DIAGNOSIS — M810 Age-related osteoporosis without current pathological fracture: Secondary | ICD-10-CM

## 2017-11-03 MED ORDER — ESTRADIOL 0.1 MG/GM VA CREA: 0.2500 | TOPICAL_CREAM | VAGINAL | 4 refills | Status: DC

## 2017-11-03 NOTE — Progress Notes (Signed)
Sarah Mclean 1960/04/30 [id number]   History:    57 y.o. [id number] and 1 adopted child.  Married  RP:  Established patient presenting for annual gyn exam   HPI: Menopause in her 31's, no HRT.  No PMB.  Last BD showed Osteoporosis.  Patient decided to not to start on medication, but instead improve her nutrition and weight bearing physical activity.  No pelvic pain.  C/O pain with IC/dryness and low libido.  Urine/BMs wnl.  Breasts normal.  Health labs with Fam MD.  Sister with Breast Ca.  Patient had genetic testing, BrCa 1-2 negative.  Past medical history,surgical history, family history and social history were all reviewed and documented in the EPIC chart.  Gynecologic History No LMP recorded. Patient is postmenopausal. Contraception: post menopausal status Last Pap: 06/2014. Results were: Negative, HPV HR neg Last mammogram: 06/2016. Results were: Negative Bone Density: 09/2016 Osteoporosis Rt Femoral Neck T-Score -2.5 Colonoscopy: 2018  Obstetric History OB History  Gravida Para Term Preterm AB Living  '6 2 2   4 2  ' SAB TAB Ectopic Multiple Live Births  4            # Outcome Date GA Lbr Len/2nd Weight Sex Delivery Anes PTL Lv  6 SAB           5 SAB           4 SAB           3 SAB           2 Term           1 Term              ROS: A ROS was performed and pertinent positives and negatives are included in the history.  GENERAL: No fevers or chills. HEENT: No change in vision, no earache, sore throat or sinus congestion. NECK: No pain or stiffness. CARDIOVASCULAR: No chest pain or pressure. No palpitations. PULMONARY: No shortness of breath, cough or wheeze. GASTROINTESTINAL: No abdominal pain, nausea, vomiting or diarrhea, melena or bright red blood per rectum. GENITOURINARY: No urinary frequency, urgency, hesitancy or dysuria. MUSCULOSKELETAL: No joint or muscle pain, no back pain, no recent trauma. DERMATOLOGIC: No rash, no itching, no lesions. ENDOCRINE: No polyuria,  polydipsia, no heat or cold intolerance. No recent change in weight. HEMATOLOGICAL: No anemia or easy bruising or bleeding. NEUROLOGIC: No headache, seizures, numbness, tingling or weakness. PSYCHIATRIC: No depression, no loss of interest in normal activity or change in sleep pattern.     Exam:   BP 124/78 (BP Location: Right Arm, Patient Position: Sitting, Cuff Size: Normal)   Ht '5\' 4"'  (1.626 m)   Wt 118 lb (53.5 kg)   BMI 20.25 kg/m   Body mass index is 20.25 kg/m.  General appearance : Well developed well nourished female. No acute distress HEENT: Eyes: no retinal hemorrhage or exudates,  Neck supple, trachea midline, no carotid bruits, no thyroidmegaly Lungs: Clear to auscultation, no rhonchi or wheezes, or rib retractions  Heart: Regular rate and rhythm, no murmurs or gallops Breast:Examined in sitting and supine position were symmetrical in appearance, no palpable masses or tenderness,  no skin retraction, no nipple inversion, no nipple discharge, no skin discoloration, no axillary or supraclavicular lymphadenopathy Abdomen: no palpable masses or tenderness, no rebound or guarding Extremities: no edema or skin discoloration or tenderness  Pelvic: Vulva: Normal             Vagina: No gross lesions  or discharge  Cervix: No gross lesions or discharge.  Pap/HPV HR done  Uterus  AV, normal size, shape and consistency, non-tender and mobile  Adnexa  Without masses or tenderness  Anus: Normal   Assessment/Plan:  57 y.o. female for annual exam   1. Encounter for routine gynecological examination with Papanicolaou smear of cervix Normal gynecologic exam in menopause.  Pap test with high risk HPV done today.  Breast exam normal.  Patient is scheduled for screening mammogram next week.  Health labs with family physician.  Colonoscopy done in 2018.  2. Menopause present Well on no hormone replacement therapy.  No postmenopausal bleeding.  3. Post-menopausal atrophic  vaginitis Dryness and pain with intercourse as well as low libido.  Decision to start on estradiol cream twice a week.  Will insert a quarter of an applicator vaginally twice a week and add a thin layer of cream on the vulva twice a week.  Usage, risks and benefits reviewed with patient.  Reassured that the risk was low as long as she uses small amounts as recommended.  4. Age-related osteoporosis without current pathological fracture Osteoporosis with a T score of -2.5 at the right femoral neck on the bone density June 2018.  After counseling, patient decided not to start on medical treatment.  Patient is optimizing her nutrition with a calcium rich diet and taking vitamin D supplements.  Her vitamin D level was normal at 49 in February 2019.  The cooking book titled "The healthy Bones" by Joanne Gavel recommended.  Encouraged to continue with weightbearing physical activity.  Will recheck bone density in June 2020.  Other orders - estradiol (ESTRACE) 0.1 MG/GM vaginal cream; Place 7.47 Applicatorfuls vaginally 2 (two) times a week. Thin layer on vulva twice a week.   [name] MD, 2:05 PM 11/03/2017

## 2017-11-03 NOTE — Patient Instructions (Signed)
1. Encounter for routine gynecological examination with Papanicolaou smear of cervix Normal gynecologic exam in menopause.  Pap test with high risk HPV done today.  Breast exam normal.  Patient is scheduled for screening mammogram next week.  Health labs with family physician.  Colonoscopy done in 2018.  2. Menopause present Well on no hormone replacement therapy.  No postmenopausal bleeding.  3. Post-menopausal atrophic vaginitis Dryness and pain with intercourse as well as low libido.  Decision to start on estradiol cream twice a week.  Will insert a quarter of an applicator vaginally twice a week and add a thin layer of cream on the vulva twice a week.  Usage, risks and benefits reviewed with patient.  Reassured that the risk was low as long as she uses small amounts as recommended.  4. Age-related osteoporosis without current pathological fracture Osteoporosis with a T score of -2.5 at the right femoral neck on the bone density June 2018.  After counseling, patient decided not to start on medical treatment.  Patient is optimizing her nutrition with a calcium rich diet and taking vitamin D supplements.  Her vitamin D level was normal at 49 in February 2019.  The cooking book titled "The healthy Bones" by Joanne Gavel recommended.  Encouraged to continue with weightbearing physical activity.  Will recheck bone density in June 2020.  Other orders - estradiol (ESTRACE) 0.1 MG/GM vaginal cream; Place 3.58 Applicatorfuls vaginally 2 (two) times a week. Thin layer on vulva twice a week.  Sarah Mclean, it was a pleasure meeting you today!

## 2017-11-04 LAB — PAP, TP IMAGING W/ HPV RNA, RFLX HPV TYPE 16,18/45: HPV DNA HIGH RISK: NOT DETECTED

## 2017-11-05 ENCOUNTER — Encounter: Payer: Self-pay | Admitting: *Deleted

## 2017-11-06 ENCOUNTER — Encounter: Payer: Self-pay | Admitting: Obstetrics & Gynecology

## 2017-11-11 ENCOUNTER — Encounter: Payer: Self-pay | Admitting: Anesthesiology

## 2018-02-22 ENCOUNTER — Ambulatory Visit (INDEPENDENT_AMBULATORY_CARE_PROVIDER_SITE_OTHER): Payer: BLUE CROSS/BLUE SHIELD | Admitting: Orthopedic Surgery

## 2018-02-22 ENCOUNTER — Ambulatory Visit (INDEPENDENT_AMBULATORY_CARE_PROVIDER_SITE_OTHER): Payer: Self-pay

## 2018-02-22 ENCOUNTER — Encounter (INDEPENDENT_AMBULATORY_CARE_PROVIDER_SITE_OTHER): Payer: Self-pay | Admitting: Orthopedic Surgery

## 2018-02-22 DIAGNOSIS — M25562 Pain in left knee: Secondary | ICD-10-CM

## 2018-02-22 DIAGNOSIS — M25572 Pain in left ankle and joints of left foot: Secondary | ICD-10-CM | POA: Diagnosis not present

## 2018-02-22 NOTE — Progress Notes (Signed)
Office Visit Note   Patient: Sarah Mclean           Date of Birth: [birth year]           MRN: [id number] Visit Date: 02/22/2018 Requested by: Darcus Austin, MD [address], [place] [address] PCP: Darcus Austin, MD  Subjective: Chief Complaint  Patient presents with  . Left Foot - Pain    HPI: Sarah Mclean is a patient with long history of left foot pain and on and off medial left knee pain.  She does have a diagnosis of osteoporosis.  She takes natural remedies for that.  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  She reports generally significant daily pain in the medial aspect of the left foot for the past month.  She states that it does not really hurt her every step that she takes and sometimes she can actually work out and it will feel better as the workout continues.  She has not taken any medication for the problem yet but her symptoms have been going on for a couple of months.  Her husband has noticed that she is limping.  She also reports left medial knee pain which she localizes to the tibial plateau region.  Not really localizing to the joint line by history.  Denies any mechanical symptoms.  Denies any swelling.  Also does not report any hip pain with this.            ROS: All systems reviewed are negative as they relate to the chief complaint within the history of present illness.  Patient denies  fevers or chills.   Assessment & Plan: Visit Diagnoses:  1. Pain in left ankle and joints of left foot   2. Acute pain of left knee     Plan: Impression is medial sided left foot pain with normal radiographs and a history which is somewhat contradictory for tendinitis and stress fracture/reaction.  I think she also has medial tibial plateau tenderness which is likely a stress reaction.  Patient does have osteoporosis and is working out essentially daily.  I think that in regards to the foot whether or not this is  tendinitis or stress reaction would change the amount of activity that she does.  Think she needs an MRI of the midfoot the talus the navicular as well as the posterior tib tendon.  I think she needs an MRI scan of that left knee to rule out stress reaction of the tibial plateau.  I will see her back after the studies  Follow-Up Instructions: No follow-ups on file.   Orders:  Orders Placed This Encounter  Procedures  . XR Foot Complete Left  . XR Ankle Complete Left  . XR Knee 1-2 Views Left  . MR Knee Left w/o contrast  . MR Foot Left w/o contrast   No orders of the defined types were placed in this encounter.     Procedures: No procedures performed   Clinical Data: No additional findings.  Objective: Vital Signs: There were no vitals taken for this visit.  Physical Exam:   Constitutional: Patient appears well-developed HEENT:  Head: Normocephalic Eyes:EOM are normal Neck: Normal range of motion Cardiovascular: Normal rate Pulmonary/chest: Effort normal Neurologic: Patient is alert Skin: Skin is warm Psychiatric: Patient has normal mood and affect    Ortho Exam: Ortho exam demonstrates full active and passive range of motion of the left knee  and left ankle.  Patient has palpable intact nontender anterior to posterior to peroneal and Achilles tendons.  He has palpable pedal pulses.  No masses lymphadenopathy or skin changes noted in the left foot region.  She has medial arch tenderness but no plantar fascia tenderness.  Her pain seems to be slightly anterior to the posterior tib tendon.  No real pain with resisted inversion of the foot.  Left knee exam is normal with no effusion stable collateral cruciate ligaments full range of motion and some tenderness just.  To the pedis tendon path in the anterior tibia  Specialty Comments:  No specialty comments available.  Imaging: No results found.   PMFS History: Patient Active Problem List   Diagnosis Date Noted  .  Age-related osteoporosis without current pathological fracture 10/27/2016  . 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 Family history of colon cancer 06/14/2012  . Family history of breast cancer 06/14/2012  . H/O vitamin D deficiency 06/14/2012  . Menopausal vaginal dryness 06/14/2012  . Dyspareunia 06/14/2012  . Rosacea   . Osteopenia    Past Medical History:  Diagnosis Date  . Menopausal state    age 56 onset  . Osteopenia   . Parasomnia   . Rosacea     Family History  Problem Relation Age of Onset  . Cancer Mother        sarcoma  . Colon cancer Sister   . Cancer Sister        colon  . Breast cancer Sister     Past Surgical History:  Procedure Laterality Date  . CESAREAN SECTION    . DILATION AND CURETTAGE OF UTERUS  2004   suction curettage  . HEMORRHOID SURGERY    . KNEE ARTHROSCOPY    . RHINOPLASTY    . TONSILLECTOMY     Social History   Occupational History    Employer: UNEMPLOYED  Tobacco Use  . Smoking status: Never Smoker  . Smokeless tobacco: Never Used  Substance and Sexual Activity  . Alcohol use: Yes    Alcohol/week: 0.0 standard drinks    Comment: rare  . Drug use: No  . Sexual activity: Yes    Birth control/protection: Post-menopausal    Comment: intercourse age 69, less than 5 sexual partners, des neg

## 2018-03-02 ENCOUNTER — Other Ambulatory Visit (INDEPENDENT_AMBULATORY_CARE_PROVIDER_SITE_OTHER): Payer: Self-pay | Admitting: Orthopedic Surgery

## 2018-03-03 ENCOUNTER — Other Ambulatory Visit (INDEPENDENT_AMBULATORY_CARE_PROVIDER_SITE_OTHER): Payer: Self-pay | Admitting: Orthopedic Surgery

## 2018-03-05 ENCOUNTER — Ambulatory Visit
Admission: RE | Admit: 2018-03-05 | Discharge: 2018-03-05 | Disposition: A | Discharge status: Home / Self Care | Payer: Self-pay | Source: Ambulatory Visit | Attending: Orthopedic Surgery | Admitting: Orthopedic Surgery

## 2018-03-05 DIAGNOSIS — M79672 Pain in left foot: Secondary | ICD-10-CM | POA: Diagnosis not present

## 2018-03-05 DIAGNOSIS — M25562 Pain in left knee: Secondary | ICD-10-CM | POA: Diagnosis not present

## 2018-03-05 DIAGNOSIS — M25572 Pain in left ankle and joints of left foot: Secondary | ICD-10-CM

## 2018-03-10 ENCOUNTER — Encounter (INDEPENDENT_AMBULATORY_CARE_PROVIDER_SITE_OTHER): Payer: Self-pay | Admitting: Orthopedic Surgery

## 2018-03-10 ENCOUNTER — Ambulatory Visit (INDEPENDENT_AMBULATORY_CARE_PROVIDER_SITE_OTHER): Payer: BLUE CROSS/BLUE SHIELD | Admitting: Orthopedic Surgery

## 2018-03-10 DIAGNOSIS — S838X2D Sprain of other specified parts of left knee, subsequent encounter: Secondary | ICD-10-CM

## 2018-03-13 ENCOUNTER — Encounter (INDEPENDENT_AMBULATORY_CARE_PROVIDER_SITE_OTHER): Payer: Self-pay | Admitting: Orthopedic Surgery
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
## 2018-06-03 DIAGNOSIS — F4321 Adjustment disorder with depressed mood: Secondary | ICD-10-CM | POA: Diagnosis not present

## 2018-06-24 DIAGNOSIS — F4321 Adjustment disorder with depressed mood: Secondary | ICD-10-CM | POA: Diagnosis not present

## 2018-07-08 DIAGNOSIS — J069 Acute upper respiratory infection, unspecified: Secondary | ICD-10-CM | POA: Diagnosis not present

## 2018-09-06 DIAGNOSIS — Z03818 Encounter for observation for suspected exposure to other biological agents ruled out: Secondary | ICD-10-CM | POA: Diagnosis not present

## 2018-10-21 DIAGNOSIS — L089 Local infection of the skin and subcutaneous tissue, unspecified: Secondary | ICD-10-CM | POA: Diagnosis not present

## 2018-11-11 ENCOUNTER — Other Ambulatory Visit: Payer: Self-pay

## 2018-11-12 ENCOUNTER — Encounter: Payer: Self-pay | Admitting: Obstetrics & Gynecology

## 2018-11-12 ENCOUNTER — Ambulatory Visit (INDEPENDENT_AMBULATORY_CARE_PROVIDER_SITE_OTHER): Payer: BC Managed Care – PPO | Admitting: Obstetrics & Gynecology

## 2018-11-12 ENCOUNTER — Other Ambulatory Visit: Payer: Self-pay

## 2018-11-12 VITALS — BP 108/70 | Ht 64.0 in | Wt 120.0 lb

## 2018-11-12 DIAGNOSIS — Z01419 Encounter for gynecological examination (general) (routine) without abnormal findings: Secondary | ICD-10-CM

## 2018-11-12 DIAGNOSIS — Z780 Asymptomatic menopausal state: Secondary | ICD-10-CM

## 2018-11-12 DIAGNOSIS — R6882 Decreased libido: Secondary | ICD-10-CM | POA: Diagnosis not present

## 2018-11-12 DIAGNOSIS — N952 Postmenopausal atrophic vaginitis: Secondary | ICD-10-CM | POA: Diagnosis not present

## 2018-11-12 DIAGNOSIS — M810 Age-related osteoporosis without current pathological fracture: Secondary | ICD-10-CM

## 2018-11-12 MED ORDER — ESTRADIOL 0.1 MG/GM VA CREA: 0.2500 | TOPICAL_CREAM | VAGINAL | 4 refills | Status: DC

## 2018-11-12 NOTE — Patient Instructions (Signed)
1. Well female exam with routine gynecological exam Normal gynecologic exam.  Pap test in July 2019 was negative with negative high-risk HPV, no indication to repeat this year.  Breast exam normal.  Will schedule a screening mammogram now.  Health labs with family physician.  2. Postmenopause Well on no hormone replacement therapy.  No postmenopausal bleeding.  3. Post-menopausal atrophic vaginitis Doing well on Estrace cream.  No contraindication to continue.  Prescription sent to pharmacy.  4. Low libido Multifactorial causes of low libido discussed with patient.  Decision to try with testosterone gel.  Prescription sent to [facility] for testosterone gel.  5. Age-related osteoporosis without current pathological fracture Repeat bone density here now.  Continue with bone supplements, calcium intake of 1200 mg daily and regular weightbearing physical activities. - DG Bone Density; Future  Other orders - OVER THE COUNTER MEDICATION; osteosheath - OVER THE COUNTER MEDICATION; Adrenal forte and a-fbetafood - estradiol (ESTRACE) 0.1 MG/GM vaginal cream; Place 1.24 Applicatorfuls vaginally 2 (two) times a week. Thin layer on vulva twice a week.  Kyra Manges, it was a pleasure seeing you today!  I will inform you of your results as soon as they are available.

## 2018-11-12 NOTE — Progress Notes (Signed)
Sarah Mclean 20-Jun-1960 [id number]   History:    58 y.o. [id number] (1 adopted)  Married.    RP:  Established patient presenting for annual gyn exam   HPI: Menopause in her 22's, no HRT.  No PMB.  Last BD showed Osteoporosis.  Patient decided not to start on medication, but instead improve her nutrition and weight bearing physical activity.  No pelvic pain.  No pain/dryness with IC using Estrace cream.  Continued low libido.  Urine/BMs wnl.  Breasts normal. BMI 20.6.  Health labs with Sarah Mclean.  Sister with Breast Ca.  Patient had genetic testing, BrCa 1-2 negative.  Past medical history,surgical history, family history and social history were all reviewed and documented in the EPIC chart.  Gynecologic History No LMP recorded. Patient is postmenopausal. Contraception: post menopausal status Last Pap: 10/2017. Results were: Negative/HPV HR negative Last mammogram: 06/2016. Results were: Negative Bone Density: 09/2016 Osteoporosis -2.5 Rt Femoral Neck Colonoscopy: 2018  Obstetric History OB History  Gravida Para Term Preterm AB Living  '6 2 2   4 2  ' SAB TAB Ectopic Multiple Live Births  4            # Outcome Date GA Lbr Len/2nd Weight Sex Delivery Anes PTL Lv  6 SAB           5 SAB           4 SAB           3 SAB           2 Term           1 Term              ROS: A ROS was performed and pertinent positives and negatives are included in the history.  GENERAL: No fevers or chills. HEENT: No change in vision, no earache, sore throat or sinus congestion. NECK: No pain or stiffness. CARDIOVASCULAR: No chest pain or pressure. No palpitations. PULMONARY: No shortness of breath, cough or wheeze. GASTROINTESTINAL: No abdominal pain, nausea, vomiting or diarrhea, melena or bright red blood per rectum. GENITOURINARY: No urinary frequency, urgency, hesitancy or dysuria. MUSCULOSKELETAL: No joint or muscle pain, no back pain, no recent trauma. DERMATOLOGIC: No rash, no itching, no lesions.  ENDOCRINE: No polyuria, polydipsia, no heat or cold intolerance. No recent change in weight. HEMATOLOGICAL: No anemia or easy bruising or bleeding. NEUROLOGIC: No headache, seizures, numbness, tingling or weakness. PSYCHIATRIC: No depression, no loss of interest in normal activity or change in sleep pattern.     Exam:   BP 108/70   Ht '5\' 4"'  (1.626 m)   Wt 120 lb (54.4 kg)   BMI 20.60 kg/m   Body mass index is 20.6 kg/m.  General appearance : Well developed well nourished female. No acute distress HEENT: Eyes: no retinal hemorrhage or exudates,  Neck supple, trachea midline, no carotid bruits, no thyroidmegaly Lungs: Clear to auscultation, no rhonchi or wheezes, or rib retractions  Heart: Regular rate and rhythm, no murmurs or gallops Breast:Examined in sitting and supine position were symmetrical in appearance, no palpable masses or tenderness,  no skin retraction, no nipple inversion, no nipple discharge, no skin discoloration, no axillary or supraclavicular lymphadenopathy Abdomen: no palpable masses or tenderness, no rebound or guarding Extremities: no edema or skin discoloration or tenderness  Pelvic: Vulva: Normal             Vagina: No gross lesions or discharge  Cervix: No gross lesions  or discharge  Uterus  AV, normal size, shape and consistency, non-tender and mobile  Adnexa  Without masses or tenderness  Anus: Normal   Assessment/Plan:  58 y.o. female for annual exam   1. Well female exam with routine gynecological exam Normal gynecologic exam.  Pap test in July 2019 was negative with negative high-risk HPV, no indication to repeat this year.  Breast exam normal.  Will schedule a screening mammogram now.  Health labs with family physician.  2. Postmenopause Well on no hormone replacement therapy.  No postmenopausal bleeding.  3. Post-menopausal atrophic vaginitis Doing well on Estrace cream.  No contraindication to continue.  Prescription sent to pharmacy.  4. Low  libido Multifactorial causes of low libido discussed with patient.  Decision to try with testosterone gel.  Prescription sent to [facility] for testosterone gel.  5. Age-related osteoporosis without current pathological fracture Repeat bone density here now.  Continue with bone supplements, calcium intake of 1200 mg daily and regular weightbearing physical activities. - DG Bone Density; Future  Other orders - OVER THE COUNTER MEDICATION; osteosheath - OVER THE COUNTER MEDICATION; Adrenal forte and a-fbetafood - estradiol (ESTRACE) 0.1 MG/GM vaginal cream; Place 3.83 Applicatorfuls vaginally 2 (two) times a week. Thin layer on vulva twice a week.  Sarah Bruins Mclean, 2:07 PM 11/12/2018

## 2018-11-15 ENCOUNTER — Telehealth: Payer: Self-pay | Admitting: *Deleted

## 2018-11-15 MED ORDER — NONFORMULARY OR COMPOUNDED ITEM: 0 refills | Status: DC

## 2018-11-15 NOTE — Telephone Encounter (Signed)
-----   Message from [name], MD sent at 11/12/2018  2:29 PM EDT ----- Regarding: Testosterone gel Please send a prescription of Testosterone gel local application for libido to [facility].  Need to talk with pharmacist for recommended dosage.  Thanks

## 2018-11-15 NOTE — Telephone Encounter (Signed)
Rx called in 

## 2018-11-16 DIAGNOSIS — H11003 Unspecified pterygium of eye, bilateral: Secondary | ICD-10-CM | POA: Diagnosis not present

## 2018-11-16 DIAGNOSIS — H524 Presbyopia: Secondary | ICD-10-CM | POA: Diagnosis not present

## 2018-11-19 DIAGNOSIS — Z20828 Contact with and (suspected) exposure to other viral communicable diseases: Secondary | ICD-10-CM | POA: Diagnosis not present

## 2018-11-24 ENCOUNTER — Encounter: Payer: Self-pay | Admitting: Obstetrics & Gynecology

## 2018-11-24 DIAGNOSIS — Z803 Family history of malignant neoplasm of breast: Secondary | ICD-10-CM | POA: Diagnosis not present

## 2018-11-24 DIAGNOSIS — Z1231 Encounter for screening mammogram for malignant neoplasm of breast: Secondary | ICD-10-CM | POA: Diagnosis not present

## 2018-12-02 ENCOUNTER — Ambulatory Visit (INDEPENDENT_AMBULATORY_CARE_PROVIDER_SITE_OTHER): Payer: BC Managed Care – PPO

## 2018-12-02 ENCOUNTER — Other Ambulatory Visit: Payer: Self-pay

## 2018-12-02 DIAGNOSIS — M8589 Other specified disorders of bone density and structure, multiple sites: Secondary | ICD-10-CM

## 2018-12-02 DIAGNOSIS — Z13820 Encounter for screening for osteoporosis: Secondary | ICD-10-CM | POA: Diagnosis not present

## 2018-12-02 DIAGNOSIS — Z780 Asymptomatic menopausal state: Secondary | ICD-10-CM | POA: Diagnosis not present

## 2018-12-02 DIAGNOSIS — M810 Age-related osteoporosis without current pathological fracture: Secondary | ICD-10-CM

## 2018-12-03 ENCOUNTER — Other Ambulatory Visit: Payer: Self-pay | Admitting: Obstetrics & Gynecology

## 2018-12-03 DIAGNOSIS — M8589 Other specified disorders of bone density and structure, multiple sites: Secondary | ICD-10-CM

## 2018-12-03 DIAGNOSIS — Z780 Asymptomatic menopausal state: Secondary | ICD-10-CM

## 2019-01-25 ENCOUNTER — Encounter: Payer: Self-pay | Admitting: Gynecology

## 2019-03-01 DIAGNOSIS — Z8639 Personal history of other endocrine, nutritional and metabolic disease: Secondary | ICD-10-CM | POA: Diagnosis not present

## 2019-03-01 DIAGNOSIS — Z13220 Encounter for screening for lipoid disorders: Secondary | ICD-10-CM | POA: Diagnosis not present

## 2019-03-01 DIAGNOSIS — Z0000 Encounter for general adult medical examination without abnormal findings: Secondary | ICD-10-CM | POA: Diagnosis not present

## 2019-03-08 DIAGNOSIS — Z20828 Contact with and (suspected) exposure to other viral communicable diseases: Secondary | ICD-10-CM | POA: Diagnosis not present

## 2019-04-13 ENCOUNTER — Other Ambulatory Visit: Payer: Self-pay | Admitting: Obstetrics & Gynecology

## 2019-05-06 ENCOUNTER — Encounter: Payer: Self-pay | Admitting: Orthopedic Surgery

## 2019-05-06 ENCOUNTER — Ambulatory Visit: Payer: Self-pay

## 2019-05-06 ENCOUNTER — Other Ambulatory Visit: Payer: Self-pay

## 2019-05-06 ENCOUNTER — Ambulatory Visit (INDEPENDENT_AMBULATORY_CARE_PROVIDER_SITE_OTHER): Payer: BC Managed Care – PPO | Admitting: Orthopedic Surgery

## 2019-05-06 DIAGNOSIS — M79604 Pain in right leg: Secondary | ICD-10-CM | POA: Diagnosis not present

## 2019-05-06 NOTE — Progress Notes (Signed)
Office Visit Note   Patient: Sarah Mclean           Date of Birth: 02-08-1961           MRN: [id number] Visit Date: 05/06/2019 Requested by: No referring provider defined for this encounter. PCP: Darcus Austin, MD (Inactive)  Subjective: Chief Complaint  Patient presents with  . Lower Back - Pain  . Right Leg - Pain    HPI: Lakie Arnold is a 59 year old female with right hip pain.  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  Developed right groin pain about 6 weeks ago.  Took 10 days off and took anti-inflammatories which did not help.  She does well with no activity but whenever she walks or tries to exercise even with yoga she develops pain in the groin region.  Denies any mechanical symptoms in the hip itself.  Has a little bit of pain with twin her glutei within the gluteal fold.  Family does have a history of pilonidal cyst but she has never had one.  This is affecting her on a daily basis as a very active person.              ROS: All systems reviewed are negative as they relate to the chief complaint within the history of present illness.  Patient denies  fevers or chills.   Assessment & Plan: Visit Diagnoses:  1. Pain in right leg     Plan: Impression is right groin pain with differential diagnosis including labral pathology versus occult arthritis which is not visible on plain radiographs today versus muscle strain versus femoral hernia versus radiculopathy.  Lumbar spine MRI from 2004 showed predominantly left-sided symptoms.  Left-sided pathology.  Due to duration of symptoms as well as the patient's very active lifestyle I think MRI scanning to rule out soft tissue cause of the pain would be indicated.  Femoral hernia less likely but also possibility.  No discrete injury with this onset of symptoms so partially healed muscle strain is also a little less likely.  Plan MRI scan with follow-up and possible intra-articular injection into the hip joint at that  time.  Follow-Up Instructions: No follow-ups on file.   Orders:  Orders Placed This Encounter  Procedures  . XR HIP UNILAT W OR W/O PELVIS 2-3 VIEWS RIGHT  . XR Lumbar Spine 2-3 Views  . MR Hip Right w/o contrast   No orders of the defined types were placed in this encounter.     Procedures: No procedures performed   Clinical Data: No additional findings.  Objective: Vital Signs: There were no vitals taken for this visit.  Physical Exam:   Constitutional: Patient appears well-developed HEENT:  Head: Normocephalic Eyes:EOM are normal Neck: Normal range of motion Cardiovascular: Normal rate Pulmonary/chest: Effort normal Neurologic: Patient is alert Skin: Skin is warm Psychiatric: Patient has normal mood and affect    Ortho Exam: Ortho exam demonstrates pretty normal gait alignment although the patient states that her husband has seen her limp recently.  No groin pain with internal X rotation of the leg.  Patient has good abduction adduction and hip flexion strength.  No real popping when moving the hip from flexion to extension.  She does not have any type of palpable mass or pilonidal cyst intergluteal crease region.  No nerve root tension signs.  No paresthesias L1 S1 bilaterally.  Specialty Comments:  No specialty comments available.  Imaging: XR HIP UNILAT W OR W/O PELVIS  2-3 VIEWS RIGHT  Result Date: 05/06/2019 AP pelvis lateral right hip reviewed.  No significant joint space narrowing or spurring in the hip joint.  No acute fracture.  Ossification rounded is noted in the region of the uterus.    PMFS History: Patient Active Problem List   Diagnosis Date Noted  . Age-related osteoporosis without current pathological fracture 10/27/2016  . 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 Family history of colon cancer 06/14/2012  . Family history of breast cancer 06/14/2012  . H/O vitamin D deficiency 06/14/2012  . Menopausal  vaginal dryness 06/14/2012  . Dyspareunia 06/14/2012  . Rosacea   . Osteopenia    Past Medical History:  Diagnosis Date  . Menopausal state    age 55 onset  . Osteopenia   . Parasomnia   . Rosacea     Family History  Problem Relation Age of Onset  . Cancer Mother        sarcoma  . Colon cancer Sister   . Cancer Sister        colon  . Breast cancer Sister     Past Surgical History:  Procedure Laterality Date  . CESAREAN SECTION    . DILATION AND CURETTAGE OF UTERUS  2004   suction curettage  . HEMORRHOID SURGERY    . KNEE ARTHROSCOPY    . RHINOPLASTY    . TONSILLECTOMY     Social History   Occupational History    Employer: UNEMPLOYED  Tobacco Use  . Smoking status: Never Smoker  . Smokeless tobacco: Never Used  Substance and Sexual Activity  . Alcohol use: Yes    Alcohol/week: 0.0 standard drinks    Comment: rare  . Drug use: No  . Sexual activity: Yes    Birth control/protection: Post-menopausal    Comment: intercourse age 74, less than 5 sexual partners, des neg

## 2019-05-17 ENCOUNTER — Other Ambulatory Visit: Payer: BC Managed Care – PPO

## 2019-05-17 ENCOUNTER — Encounter: Payer: Self-pay | Admitting: Orthopedic Surgery

## 2019-05-18 ENCOUNTER — Ambulatory Visit: Payer: Self-pay | Admitting: Orthopedic Surgery

## 2019-05-18 NOTE — Telephone Encounter (Signed)
I felt like from the clinic note that the hip joint was the cause of the symptoms.  I did not think this was coming from the back.  I do not think a scan of the leg is indicated but based on the presenting history I thought that it could be coming from the hip.  The other possibility is that this is coming from the back.  Nonetheless for choices in terms of how to proceed I would say the options are observation, intra-articular hip injection, hip MRI scan.  Please call thanks

## 2019-05-23 ENCOUNTER — Ambulatory Visit: Payer: BC Managed Care – PPO | Admitting: Orthopedic Surgery

## 2019-05-30 DIAGNOSIS — M24151 Other articular cartilage disorders, right hip: Secondary | ICD-10-CM | POA: Diagnosis not present

## 2019-06-01 ENCOUNTER — Ambulatory Visit (INDEPENDENT_AMBULATORY_CARE_PROVIDER_SITE_OTHER): Payer: BC Managed Care – PPO | Admitting: Orthopedic Surgery

## 2019-06-01 ENCOUNTER — Other Ambulatory Visit: Payer: Self-pay

## 2019-06-01 DIAGNOSIS — M79604 Pain in right leg: Secondary | ICD-10-CM | POA: Diagnosis not present

## 2019-06-03 ENCOUNTER — Encounter: Payer: Self-pay | Admitting: Orthopedic Surgery

## 2019-06-03 NOTE — Progress Notes (Signed)
Office Visit Note   Patient: Sarah Mclean           Date of Birth: [birth year]           MRN: [id number] Visit Date: 06/01/2019 Requested by: No referring provider defined for this encounter. PCP: Darcus Austin, MD (Inactive)  Subjective: Chief Complaint  Patient presents with  . Follow-up   HPI: Sarah Mclean is a patient with right hip and leg pain.  Since have seen her she has had an MRIScan of her right hip which is reviewed.  I do not have access to that study but the report says degenerative superior lateral labral tear.  No arthritis or other problem with the musculature in the hip region.  Today she is not having any pain.  She did have some pain last week.  Whenever she does something more active the pain flares up but it does radiate down from her groin below her knee to her feet.  Denies much in the way of low back pain.  She is doing yoga only.  She had an MRI scan in 2004 which did show desiccation of the L2-3 disc.               ROS: All systems reviewed are negative as they relate to the chief complaint within the history of present illness.  Patient denies  fevers or chills.   Assessment & Plan: Visit Diagnoses:  1. Pain in right leg     Plan: Impression is right hip pain with fairly underwhelming by report superior degenerative type labral tear.  No arthritis in the hip joint.  Radiation of the pain down to the foot suggest more likely that this could be a far lateral disc herniation.  She did have desiccation of the L2-3 disc about 15 years ago and I think that has likely progressed to become a bulging disc affecting that right hip.  Next intervention would be diagnostic and therapeutic hip injection should symptoms recur along with MRI scan of the back to look at that L2-3 disc.  Follow-Up Instructions: Return if symptoms worsen or fail to improve.   Orders:  No orders of the defined types were placed in this encounter.  No orders of the defined types were placed in  this encounter.     Procedures: No procedures performed   Clinical Data: No additional findings.  Objective: Vital Signs: There were no vitals taken for this visit.  Physical Exam:   Constitutional: Patient appears well-developed HEENT:  Head: Normocephalic Eyes:EOM are normal Neck: Normal range of motion Cardiovascular: Normal rate Pulmonary/chest: Effort normal Neurologic: Patient is alert Skin: Skin is warm Psychiatric: Patient has normal mood and affect    Ortho Exam: Orthopedic exam demonstrates full active and passive range of motion of both hips.  No groin pain with internal X rotation of the leg.  Ankle dorsiflexion plantarflexion quad hamstring strength 5+ out of 5 bilaterally with no definite paresthesias L1 S1 bilaterally.  No real trochanteric tenderness.  No muscle atrophy in the right leg.  Specialty Comments:  No specialty comments available.  Imaging: No results found.   PMFS History: Patient Active Problem List   Diagnosis Date Noted  . Age-related osteoporosis without current pathological fracture 10/27/2016  . 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 Family history of colon cancer 06/14/2012  . Family history of breast cancer 06/14/2012  . H/O vitamin D deficiency 06/14/2012  . Menopausal vaginal dryness 06/14/2012  .  Dyspareunia 06/14/2012  . Rosacea   . Osteopenia    Past Medical History:  Diagnosis Date  . Menopausal state    age 38 onset  . Osteopenia   . Parasomnia   . Rosacea     Family History  Problem Relation Age of Onset  . Cancer Mother        sarcoma  . Colon cancer Sister   . Cancer Sister        colon  . Breast cancer Sister     Past Surgical History:  Procedure Laterality Date  . CESAREAN SECTION    . DILATION AND CURETTAGE OF UTERUS  2004   suction curettage  . HEMORRHOID SURGERY    . KNEE ARTHROSCOPY    . RHINOPLASTY    . TONSILLECTOMY     Social History    Occupational History    Employer: UNEMPLOYED  Tobacco Use  . Smoking status: Never Smoker  . Smokeless tobacco: Never Used  Substance and Sexual Activity  . Alcohol use: Yes    Alcohol/week: 0.0 standard drinks    Comment: rare  . Drug use: No  . Sexual activity: Yes    Birth control/protection: Post-menopausal    Comment: intercourse age 3, less than 5 sexual partners, des neg

## 2019-06-25 ENCOUNTER — Ambulatory Visit: Payer: BC Managed Care – PPO | Attending: Internal Medicine

## 2019-06-25 DIAGNOSIS — Z23 Encounter for immunization: Secondary | ICD-10-CM | POA: Insufficient documentation

## 2019-06-25 NOTE — Progress Notes (Signed)
   Covid-19 Vaccination Clinic  Name:  Sarah Mclean    MRN: [id number] DOB: 07/11/1960  06/25/2019  [name] was observed post Covid-19 immunization for 15 minutes without incidence. She was provided with Vaccine Information Sheet and instruction to access the V-Safe system.   [name] was instructed to call 911 with any severe reactions post vaccine: Marland Kitchen Difficulty breathing  . Swelling of your face and throat  . A fast heartbeat  . A bad rash all over your body  . Dizziness and weakness    Immunizations Administered    Name Date Dose VIS Date Route   Pfizer COVID-19 Vaccine 06/25/2019 12:07 PM 0.3 mL 04/08/2019 Intramuscular   Manufacturer: [place]   Lot: [id number]   [place]: [id number]

## 2019-07-12 DIAGNOSIS — L570 Actinic keratosis: Secondary | ICD-10-CM | POA: Diagnosis not present

## 2019-07-12 DIAGNOSIS — L814 Other melanin hyperpigmentation: Secondary | ICD-10-CM | POA: Diagnosis not present

## 2019-07-12 DIAGNOSIS — Z86018 Personal history of other benign neoplasm: Secondary | ICD-10-CM | POA: Diagnosis not present

## 2019-07-12 DIAGNOSIS — D225 Melanocytic nevi of trunk: Secondary | ICD-10-CM | POA: Diagnosis not present

## 2019-07-12 DIAGNOSIS — L821 Other seborrheic keratosis: Secondary | ICD-10-CM | POA: Diagnosis not present

## 2019-07-16 ENCOUNTER — Ambulatory Visit: Payer: BC Managed Care – PPO | Attending: Internal Medicine

## 2019-07-16 DIAGNOSIS — Z23 Encounter for immunization: Secondary | ICD-10-CM

## 2019-07-16 NOTE — Progress Notes (Signed)
   Covid-19 Vaccination Clinic  Name:  Sarah Mclean    MRN: [id number] DOB: 1961-03-29  07/16/2019  [name] was observed post Covid-19 immunization for 15 minutes without incident. She was provided with Vaccine Information Sheet and instruction to access the V-Safe system.   [name] was instructed to call 911 with any severe reactions post vaccine: Marland Kitchen Difficulty breathing  . Swelling of face and throat  . A fast heartbeat  . A bad rash all over body  . Dizziness and weakness   Immunizations Administered    Name Date Dose VIS Date Route   Pfizer COVID-19 Vaccine 07/16/2019 11:37 AM 0.3 mL 04/08/2019 Intramuscular   Manufacturer: [place]   Lot: [id number]   [place]: [id number]

## 2019-07-19 DIAGNOSIS — M25531 Pain in right wrist: Secondary | ICD-10-CM | POA: Diagnosis not present

## 2019-07-20 ENCOUNTER — Ambulatory Visit: Payer: BC Managed Care – PPO

## 2019-07-28 DIAGNOSIS — Z03818 Encounter for observation for suspected exposure to other biological agents ruled out: Secondary | ICD-10-CM | POA: Diagnosis not present

## 2019-07-28 DIAGNOSIS — Z20828 Contact with and (suspected) exposure to other viral communicable diseases: Secondary | ICD-10-CM | POA: Diagnosis not present

## 2019-08-16 ENCOUNTER — Telehealth: Payer: Self-pay | Admitting: Orthopedic Surgery

## 2019-08-16 NOTE — Telephone Encounter (Signed)
Received vm fom pt inquiring about 2004 MRI. IC, lmvm advised that a 2004 Lspine report is available but no images. Need to come by and sign a release form and we can print copy of MRI report for her.

## 2019-11-08 DIAGNOSIS — Z03818 Encounter for observation for suspected exposure to other biological agents ruled out: Secondary | ICD-10-CM | POA: Diagnosis not present

## 2019-11-08 DIAGNOSIS — Z20822 Contact with and (suspected) exposure to covid-19: Secondary | ICD-10-CM | POA: Diagnosis not present

## 2019-11-14 ENCOUNTER — Encounter: Payer: BC Managed Care – PPO | Admitting: Obstetrics & Gynecology

## 2019-12-21 ENCOUNTER — Encounter: Payer: Self-pay | Admitting: Obstetrics & Gynecology

## 2019-12-21 DIAGNOSIS — Z1231 Encounter for screening mammogram for malignant neoplasm of breast: Secondary | ICD-10-CM | POA: Diagnosis not present

## 2019-12-28 DIAGNOSIS — H524 Presbyopia: Secondary | ICD-10-CM | POA: Diagnosis not present

## 2019-12-28 DIAGNOSIS — H43813 Vitreous degeneration, bilateral: Secondary | ICD-10-CM | POA: Diagnosis not present

## 2019-12-28 DIAGNOSIS — H11003 Unspecified pterygium of eye, bilateral: Secondary | ICD-10-CM | POA: Diagnosis not present

## 2020-01-04 ENCOUNTER — Encounter: Payer: BC Managed Care – PPO | Admitting: Obstetrics & Gynecology

## 2020-01-04 DIAGNOSIS — Z0289 Encounter for other administrative examinations: Secondary | ICD-10-CM

## 2020-01-05 IMAGING — MR MR KNEE*L* W/O CM
4 of 7 series · 22 of 40 positions shown · non-contrast
Comparison: None.

CLINICAL DATA: Chronic left knee pain. No known injury. Remote
history of arthroscopic surgery.

EXAM:
MRI OF THE LEFT KNEE WITHOUT CONTRAST
TECHNIQUE: Multiplanar, multisequence MR imaging of the knee was performed. No
intravenous contrast was administered.

[Series 3: T2 fat-sat · axial · 4.0mm · 0.50mm/px · z∈[-72,+43]mm · 6 of 24 slices shown]
[im 1/24]
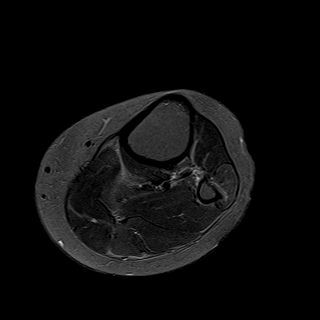
[im 5/24]
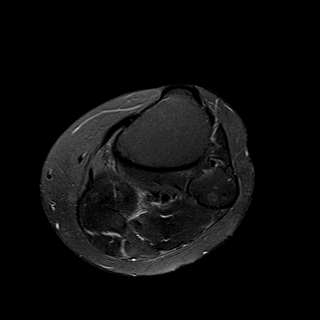
[im 10/24]
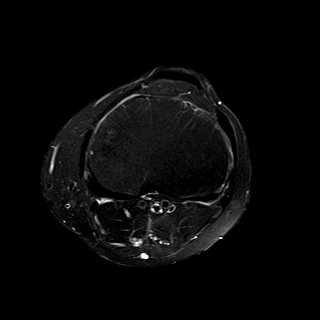
[im 14/24]
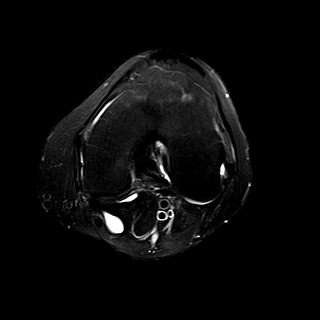
[im 19/24]
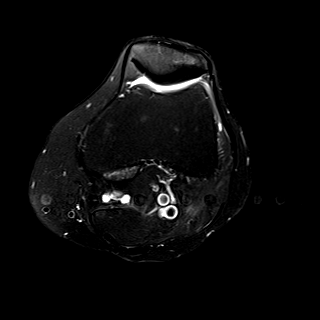
[im 24/24]
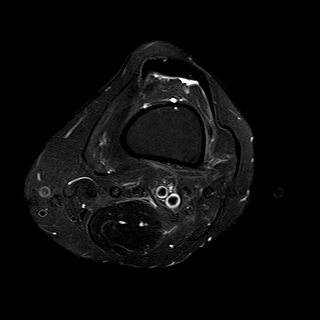

[Series 7: PD fat-sat · sagittal · 3.0mm · 0.29mm/px · 6 of 27 slices shown (1 of 3)]
[im 1/27]
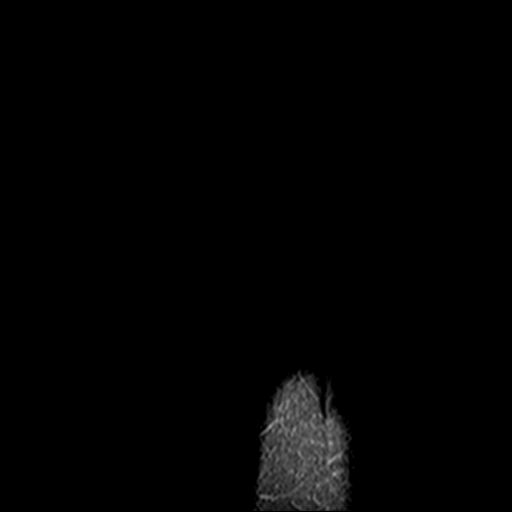
[im 6/27]
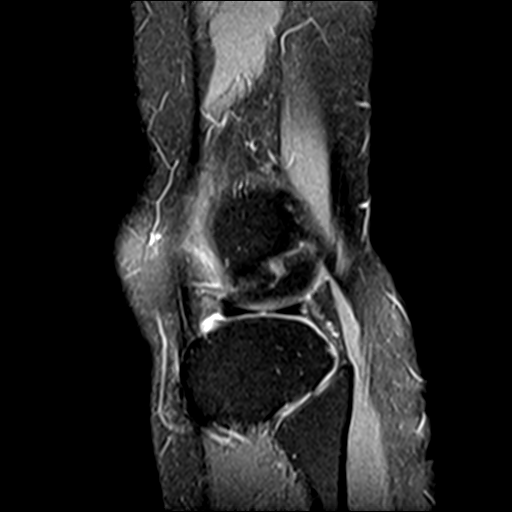
[im 11/27]
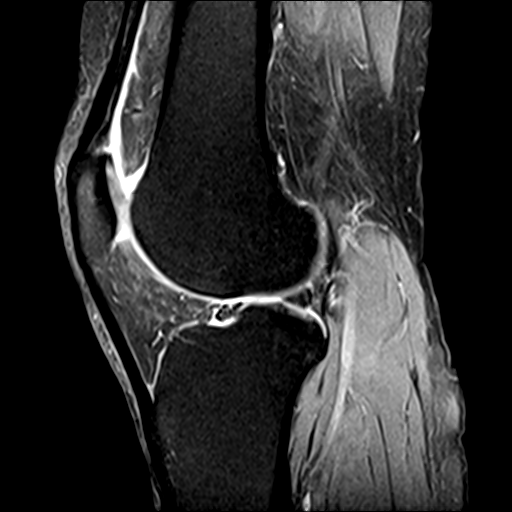
[im 16/27]
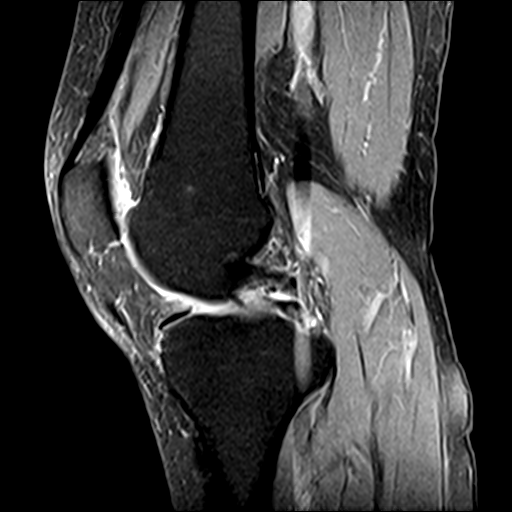
[im 21/27]
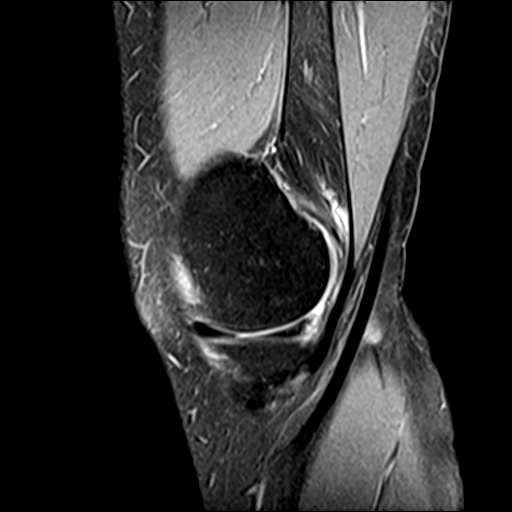
[im 27/27]
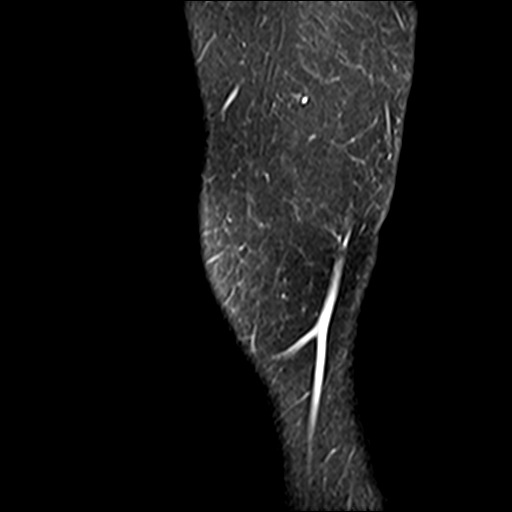

[Series 8: PD fat-sat · coronal · 3.0mm · 0.29mm/px · 7 of 30 slices shown (2 of 3)]
[im 1/30]
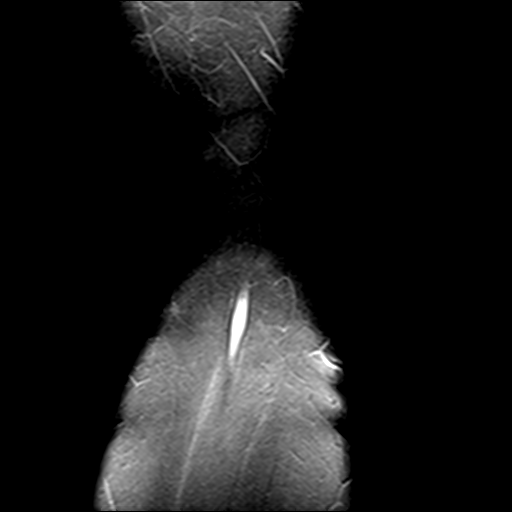
[im 5/30]
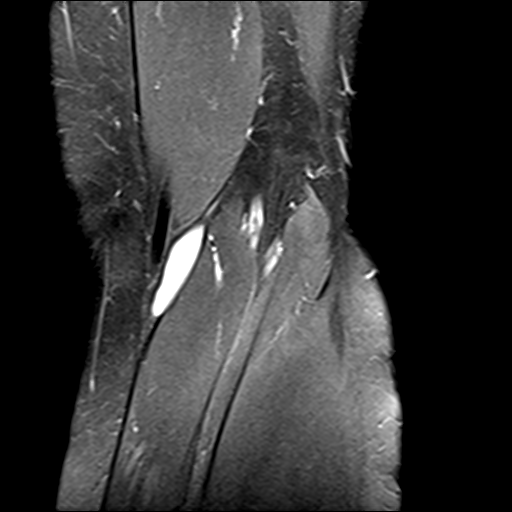
[im 10/30]
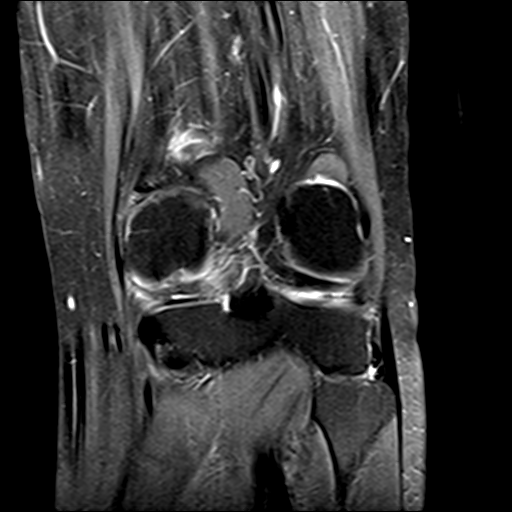
[im 15/30]
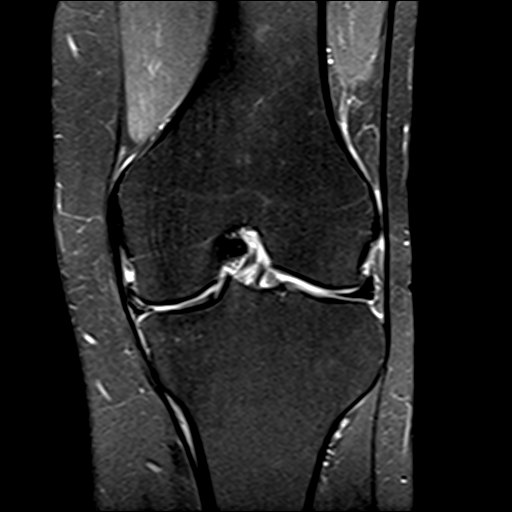
[im 20/30]
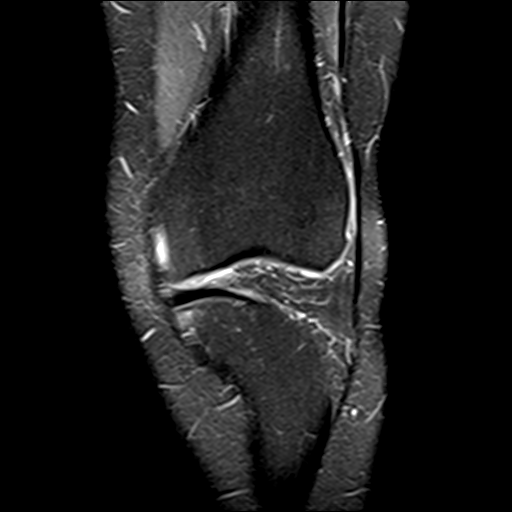
[im 25/30]
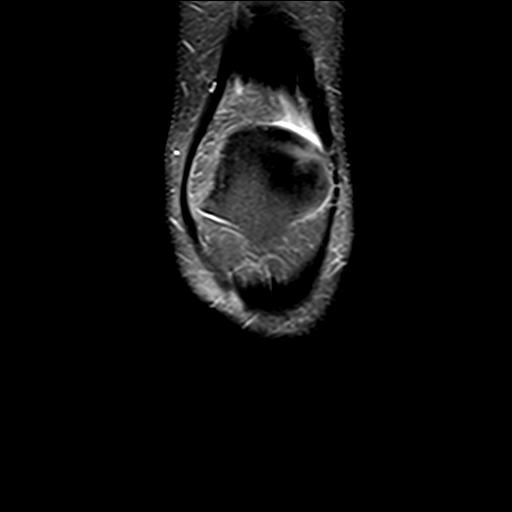
[im 30/30]
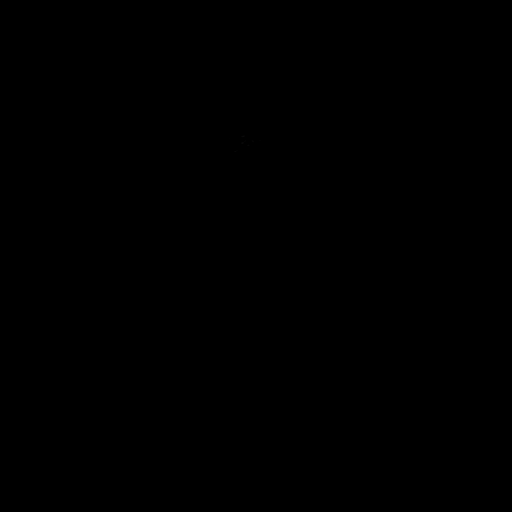

[Series 9: PD fat-sat · oblique · 2.0mm · 0.29mm/px · 3 of 11 slices shown (3 of 3)]
[im 1/11]
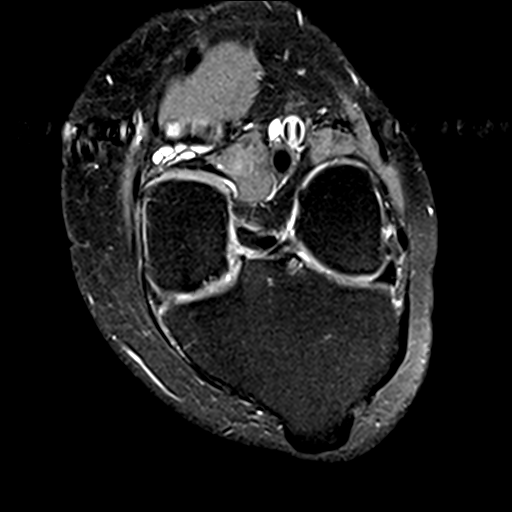
[im 6/11]
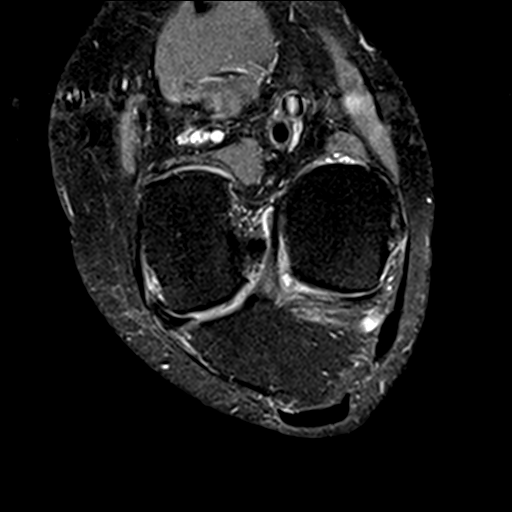
[im 11/11]
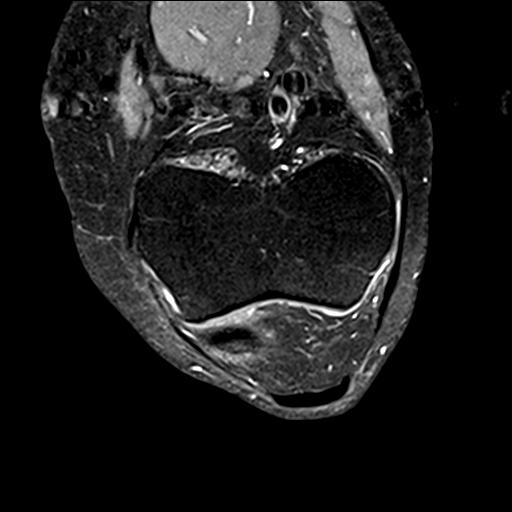

[22 of 40 positions shown; findings below may reference images not displayed]

FINDINGS: MENISCI

Medial meniscus: There is a large horizontal tear in the posterior
horn reaching the femoral articular surface. Also seen is radial
tearing along the free edge of the posterior horn just peripheral to
the meniscal root. The posterior body of the medial meniscus is
diminutive, possibly due to prior surgery.

Lateral meniscus:  Intact.

LIGAMENTS

Cruciates:  Intact.

Collaterals:  Intact.

CARTILAGE

Patellofemoral:  Preserved.

Medial:  Moderately thinned without focal defect.

Lateral:  Minimally degenerated.

Joint:  Small joint effusion.

Popliteal Fossa: Baker's cyst measures approximately 1.8 cm AP by
0.7 cm transverse by 4 cm craniocaudal.

Extensor Mechanism:  Intact.

Bones: No fracture or worrisome lesion. Minimal osteophytosis about
the knee noted.

Other: None.
IMPRESSION: Complex tear of the posterior horn of the medial meniscus includes a
radial component along the free edge just peripheral to the meniscal
root and a horizontal component reaching the femoral articular
surface. Diminutive body of the medial meniscus could be due to
prior surgery or degeneration.

Mild-to-moderate medial and lateral compartment osteoarthritis.

Small Baker's cyst.

## 2020-02-20 DIAGNOSIS — L570 Actinic keratosis: Secondary | ICD-10-CM | POA: Diagnosis not present

## 2020-03-13 DIAGNOSIS — Z0000 Encounter for general adult medical examination without abnormal findings: Secondary | ICD-10-CM | POA: Diagnosis not present

## 2020-03-13 DIAGNOSIS — Z23 Encounter for immunization: Secondary | ICD-10-CM | POA: Diagnosis not present

## 2020-03-13 DIAGNOSIS — E7800 Pure hypercholesterolemia, unspecified: Secondary | ICD-10-CM | POA: Diagnosis not present

## 2020-03-13 DIAGNOSIS — M810 Age-related osteoporosis without current pathological fracture: Secondary | ICD-10-CM | POA: Diagnosis not present

## 2020-03-27 ENCOUNTER — Encounter: Payer: Self-pay | Admitting: Obstetrics & Gynecology

## 2020-03-27 ENCOUNTER — Ambulatory Visit (INDEPENDENT_AMBULATORY_CARE_PROVIDER_SITE_OTHER): Payer: BC Managed Care – PPO | Admitting: Obstetrics & Gynecology

## 2020-03-27 ENCOUNTER — Other Ambulatory Visit: Payer: Self-pay

## 2020-03-27 VITALS — BP 126/72 | Ht 64.0 in | Wt 123.0 lb

## 2020-03-27 DIAGNOSIS — N952 Postmenopausal atrophic vaginitis: Secondary | ICD-10-CM | POA: Diagnosis not present

## 2020-03-27 DIAGNOSIS — M8589 Other specified disorders of bone density and structure, multiple sites: Secondary | ICD-10-CM | POA: Diagnosis not present

## 2020-03-27 DIAGNOSIS — Z01419 Encounter for gynecological examination (general) (routine) without abnormal findings: Secondary | ICD-10-CM

## 2020-03-27 DIAGNOSIS — Z780 Asymptomatic menopausal state: Secondary | ICD-10-CM | POA: Diagnosis not present

## 2020-03-27 MED ORDER — ESTRADIOL 0.1 MG/GM VA CREA: 1.0000 | TOPICAL_CREAM | VAGINAL | 4 refills | Status: DC

## 2020-03-27 NOTE — Progress Notes (Signed)
Sarah Mclean 1960-09-24 [id number]   History:    59 y.o. C3U1T1Y3 (1 adopted)  Married.    RP:  Established patient presenting for annual gyn exam   HPI: Postmenopause in her 38's, no HRT. No PMB. Last BD showed Osteopenia at multiple sites, lowest T-Score -2.4 at the Spine. Patient decided not to start on medication, but instead improve her nutrition and weight bearing physical activity.  Taking a Bone Supplement with Vit D.  No pelvic pain. No pain/dryness with IC using Estrace cream.  Continued low libido. Urine/BMs wnl. Breasts normal. BMI 21.11. Health labs with Fam MD. Sister with Breast Ca.  Sister had genetic testing, BrCa 1-2 negative.  Health labs with Fam MD.  Past medical history,surgical history, family history and social history were all reviewed and documented in the EPIC chart.  Gynecologic History No LMP recorded. Patient is postmenopausal.  Obstetric History OB History  Gravida Para Term Preterm AB Living  '6 2 2   4 2  ' SAB TAB Ectopic Multiple Live Births  4            # Outcome Date GA Lbr Len/2nd Weight Sex Delivery Anes PTL Lv  6 SAB           5 SAB           4 SAB           3 SAB           2 Term           1 Term              ROS: A ROS was performed and pertinent positives and negatives are included in the history.  GENERAL: No fevers or chills. HEENT: No change in vision, no earache, sore throat or sinus congestion. NECK: No pain or stiffness. CARDIOVASCULAR: No chest pain or pressure. No palpitations. PULMONARY: No shortness of breath, cough or wheeze. GASTROINTESTINAL: No abdominal pain, nausea, vomiting or diarrhea, melena or bright red blood per rectum. GENITOURINARY: No urinary frequency, urgency, hesitancy or dysuria. MUSCULOSKELETAL: No joint or muscle pain, no back pain, no recent trauma. DERMATOLOGIC: No rash, no itching, no lesions. ENDOCRINE: No polyuria, polydipsia, no heat or cold intolerance. No recent change in weight.  HEMATOLOGICAL: No anemia or easy bruising or bleeding. NEUROLOGIC: No headache, seizures, numbness, tingling or weakness. PSYCHIATRIC: No depression, no loss of interest in normal activity or change in sleep pattern.     Exam:   BP 126/72   Ht '5\' 4"'  (1.626 m)   Wt 123 lb (55.8 kg)   BMI 21.11 kg/m   Body mass index is 21.11 kg/m.  General appearance : Well developed well nourished female. No acute distress HEENT: Eyes: no retinal hemorrhage or exudates,  Neck supple, trachea midline, no carotid bruits, no thyroidmegaly Lungs: Clear to auscultation, no rhonchi or wheezes, or rib retractions  Heart: Regular rate and rhythm, no murmurs or gallops Breast:Examined in sitting and supine position were symmetrical in appearance, no palpable masses or tenderness,  no skin retraction, no nipple inversion, no nipple discharge, no skin discoloration, no axillary or supraclavicular lymphadenopathy Abdomen: no palpable masses or tenderness, no rebound or guarding Extremities: no edema or skin discoloration or tenderness  Pelvic: Vulva: Normal             Vagina: No gross lesions or discharge  Cervix: No gross lesions or discharge  Uterus AV, normal size, shape and consistency, non-tender and mobile  Adnexa  Without masses or tenderness  Anus: Normal   Assessment/Plan:  59 y.o. female for annual exam   1. Well female exam with routine gynecological exam Normal gynecologic exam in menopause.  No indication for Pap test this year.  Breast exam normal.  Screening mammogram August 2021 was negative.  Colonoscopy 2018.  Fasting health labs with family physician.  2. Postmenopause Well on no hormone replacement therapy.  No postmenopausal bleeding.  Vitamin D supplements, calcium intake of 1500 mg daily including nutritional and regular weightbearing physical activities to continue.  3. Post-menopausal atrophic vaginitis Well on estradiol cream vaginally twice a week.  No contraindication to  continue.  Prescription sent to pharmacy.  4. Osteopenia of multiple sites Osteopenia on bone density August 2020.  We will repeat a bone density August 2022.  Continue with vitamin D supplements, calcium intake of 1500 mg daily including nutritional, regular weightbearing physical activities. - DG Bone Density; Future  Other orders - OVER THE COUNTER MEDICATION; A-F BETA FOOD-  GALLBLADDER HEALTH - OVER THE COUNTER MEDICATION; DIGESTIN - cholecalciferol (VITAMIN D3) 25 MCG (1000 UNIT) tablet; Take 4,000 Units by mouth daily. - estradiol (ESTRACE) 0.1 MG/GM vaginal cream; Place 1 Applicatorful vaginally 2 (two) times a week.  [name] MD, 2:47 PM 03/27/2020

## 2020-03-28 ENCOUNTER — Encounter: Payer: Self-pay | Admitting: Obstetrics & Gynecology

## 2020-05-03 DIAGNOSIS — Z62820 Parent-biological child conflict: Secondary | ICD-10-CM | POA: Diagnosis not present

## 2020-05-03 DIAGNOSIS — Z719 Counseling, unspecified: Secondary | ICD-10-CM | POA: Diagnosis not present

## 2020-05-07 ENCOUNTER — Other Ambulatory Visit: Payer: Self-pay | Admitting: Obstetrics & Gynecology

## 2020-05-09 NOTE — Telephone Encounter (Signed)
Called into pharmacy

## 2020-05-17 DIAGNOSIS — Z719 Counseling, unspecified: Secondary | ICD-10-CM | POA: Diagnosis not present

## 2020-05-17 DIAGNOSIS — Z62820 Parent-biological child conflict: Secondary | ICD-10-CM | POA: Diagnosis not present

## 2020-05-31 DIAGNOSIS — Z719 Counseling, unspecified: Secondary | ICD-10-CM | POA: Diagnosis not present

## 2020-05-31 DIAGNOSIS — Z62820 Parent-biological child conflict: Secondary | ICD-10-CM | POA: Diagnosis not present

## 2020-06-14 DIAGNOSIS — Z62820 Parent-biological child conflict: Secondary | ICD-10-CM | POA: Diagnosis not present

## 2020-06-14 DIAGNOSIS — Z719 Counseling, unspecified: Secondary | ICD-10-CM | POA: Diagnosis not present

## 2020-06-14 DIAGNOSIS — F4320 Adjustment disorder, unspecified: Secondary | ICD-10-CM | POA: Diagnosis not present

## 2020-06-28 DIAGNOSIS — Z62820 Parent-biological child conflict: Secondary | ICD-10-CM | POA: Diagnosis not present

## 2020-06-28 DIAGNOSIS — Z719 Counseling, unspecified: Secondary | ICD-10-CM | POA: Diagnosis not present

## 2020-06-28 DIAGNOSIS — F4320 Adjustment disorder, unspecified: Secondary | ICD-10-CM | POA: Diagnosis not present

## 2020-07-11 DIAGNOSIS — Z86018 Personal history of other benign neoplasm: Secondary | ICD-10-CM | POA: Diagnosis not present

## 2020-07-11 DIAGNOSIS — L814 Other melanin hyperpigmentation: Secondary | ICD-10-CM | POA: Diagnosis not present

## 2020-07-11 DIAGNOSIS — D225 Melanocytic nevi of trunk: Secondary | ICD-10-CM | POA: Diagnosis not present

## 2020-07-11 DIAGNOSIS — L821 Other seborrheic keratosis: Secondary | ICD-10-CM | POA: Diagnosis not present

## 2020-07-12 DIAGNOSIS — Z719 Counseling, unspecified: Secondary | ICD-10-CM | POA: Diagnosis not present

## 2020-07-12 DIAGNOSIS — Z62820 Parent-biological child conflict: Secondary | ICD-10-CM | POA: Diagnosis not present

## 2020-07-12 DIAGNOSIS — F4320 Adjustment disorder, unspecified: Secondary | ICD-10-CM | POA: Diagnosis not present

## 2020-08-02 DIAGNOSIS — Z719 Counseling, unspecified: Secondary | ICD-10-CM | POA: Diagnosis not present

## 2020-08-02 DIAGNOSIS — Z62820 Parent-biological child conflict: Secondary | ICD-10-CM | POA: Diagnosis not present

## 2020-08-02 DIAGNOSIS — F4320 Adjustment disorder, unspecified: Secondary | ICD-10-CM | POA: Diagnosis not present

## 2020-08-20 DIAGNOSIS — D485 Neoplasm of uncertain behavior of skin: Secondary | ICD-10-CM | POA: Diagnosis not present

## 2020-08-20 DIAGNOSIS — D229 Melanocytic nevi, unspecified: Secondary | ICD-10-CM | POA: Diagnosis not present

## 2020-08-23 DIAGNOSIS — Z62820 Parent-biological child conflict: Secondary | ICD-10-CM | POA: Diagnosis not present

## 2020-08-23 DIAGNOSIS — Z719 Counseling, unspecified: Secondary | ICD-10-CM | POA: Diagnosis not present

## 2020-08-23 DIAGNOSIS — F4320 Adjustment disorder, unspecified: Secondary | ICD-10-CM | POA: Diagnosis not present

## 2020-09-06 DIAGNOSIS — Z62820 Parent-biological child conflict: Secondary | ICD-10-CM | POA: Diagnosis not present

## 2020-09-06 DIAGNOSIS — Z719 Counseling, unspecified: Secondary | ICD-10-CM | POA: Diagnosis not present

## 2020-09-06 DIAGNOSIS — F4320 Adjustment disorder, unspecified: Secondary | ICD-10-CM | POA: Diagnosis not present

## 2020-10-03 DIAGNOSIS — E7800 Pure hypercholesterolemia, unspecified: Secondary | ICD-10-CM | POA: Diagnosis not present

## 2020-10-03 DIAGNOSIS — M5412 Radiculopathy, cervical region: Secondary | ICD-10-CM | POA: Diagnosis not present

## 2020-10-04 DIAGNOSIS — F4320 Adjustment disorder, unspecified: Secondary | ICD-10-CM | POA: Diagnosis not present

## 2020-10-09 ENCOUNTER — Other Ambulatory Visit (HOSPITAL_COMMUNITY): Payer: Self-pay | Admitting: Family Medicine

## 2020-10-18 DIAGNOSIS — F4320 Adjustment disorder, unspecified: Secondary | ICD-10-CM | POA: Diagnosis not present

## 2020-10-23 ENCOUNTER — Ambulatory Visit (HOSPITAL_BASED_OUTPATIENT_CLINIC_OR_DEPARTMENT_OTHER)
Admission: RE | Admit: 2020-10-23 | Discharge: 2020-10-23 | Disposition: A | Discharge status: Home / Self Care | Payer: Self-pay | Source: Ambulatory Visit | Attending: Family Medicine | Admitting: Family Medicine

## 2020-10-23 ENCOUNTER — Other Ambulatory Visit: Payer: Self-pay

## 2020-10-23 DIAGNOSIS — E7800 Pure hypercholesterolemia, unspecified: Secondary | ICD-10-CM | POA: Insufficient documentation

## 2020-10-23 DIAGNOSIS — M5412 Radiculopathy, cervical region: Secondary | ICD-10-CM | POA: Insufficient documentation

## 2020-10-31 ENCOUNTER — Other Ambulatory Visit (HOSPITAL_COMMUNITY): Payer: Self-pay | Admitting: Family Medicine

## 2020-10-31 DIAGNOSIS — I77819 Aortic ectasia, unspecified site: Secondary | ICD-10-CM

## 2020-11-12 ENCOUNTER — Other Ambulatory Visit: Payer: Self-pay

## 2020-11-12 ENCOUNTER — Ambulatory Visit (HOSPITAL_COMMUNITY)
Admission: RE | Admit: 2020-11-12 | Discharge: 2020-11-12 | Disposition: A | Discharge status: Home / Self Care | Payer: BC Managed Care – PPO | Source: Ambulatory Visit | Attending: Family Medicine | Admitting: Family Medicine

## 2020-11-12 DIAGNOSIS — I77819 Aortic ectasia, unspecified site: Secondary | ICD-10-CM | POA: Insufficient documentation

## 2020-11-12 DIAGNOSIS — I77810 Thoracic aortic ectasia: Secondary | ICD-10-CM | POA: Insufficient documentation

## 2020-11-12 LAB — ECHOCARDIOGRAM COMPLETE
Area-P 1/2: 3.72 cm2
S' Lateral: 2.2 cm

## 2020-12-13 DIAGNOSIS — F4320 Adjustment disorder, unspecified: Secondary | ICD-10-CM | POA: Diagnosis not present

## 2020-12-26 DIAGNOSIS — Z1231 Encounter for screening mammogram for malignant neoplasm of breast: Secondary | ICD-10-CM | POA: Diagnosis not present

## 2020-12-27 DIAGNOSIS — F4320 Adjustment disorder, unspecified: Secondary | ICD-10-CM | POA: Diagnosis not present

## 2020-12-27 DIAGNOSIS — Z20822 Contact with and (suspected) exposure to covid-19: Secondary | ICD-10-CM | POA: Diagnosis not present

## 2021-01-02 ENCOUNTER — Encounter: Payer: Self-pay | Admitting: Obstetrics & Gynecology

## 2021-01-11 ENCOUNTER — Encounter: Payer: Self-pay | Admitting: Obstetrics & Gynecology

## 2021-01-11 DIAGNOSIS — R922 Inconclusive mammogram: Secondary | ICD-10-CM | POA: Diagnosis not present

## 2021-01-11 DIAGNOSIS — R928 Other abnormal and inconclusive findings on diagnostic imaging of breast: Secondary | ICD-10-CM | POA: Diagnosis not present

## 2021-01-24 DIAGNOSIS — F4320 Adjustment disorder, unspecified: Secondary | ICD-10-CM | POA: Diagnosis not present

## 2021-01-25 ENCOUNTER — Other Ambulatory Visit: Payer: Self-pay | Admitting: Radiology

## 2021-01-25 DIAGNOSIS — N6323 Unspecified lump in the left breast, lower outer quadrant: Secondary | ICD-10-CM | POA: Diagnosis not present

## 2021-01-25 DIAGNOSIS — N6325 Unspecified lump in the left breast, overlapping quadrants: Secondary | ICD-10-CM | POA: Diagnosis not present

## 2021-01-25 DIAGNOSIS — N6012 Diffuse cystic mastopathy of left breast: Secondary | ICD-10-CM | POA: Diagnosis not present

## 2021-01-29 ENCOUNTER — Other Ambulatory Visit: Payer: Self-pay | Admitting: Obstetrics & Gynecology

## 2021-01-29 ENCOUNTER — Encounter: Payer: Self-pay | Admitting: Obstetrics & Gynecology

## 2021-01-29 NOTE — Telephone Encounter (Signed)
AEX 03/27/20 Scheduled 03/28/21.

## 2021-02-01 ENCOUNTER — Other Ambulatory Visit: Payer: Self-pay | Admitting: Obstetrics & Gynecology

## 2021-02-04 NOTE — Telephone Encounter (Signed)
Refill Rx already sent to [facility] for approval.

## 2021-02-04 NOTE — Addendum Note (Signed)
Addended by: Ramond Craver on: 02/04/2021 09:14 AM   Modules accepted: Orders

## 2021-02-05 ENCOUNTER — Ambulatory Visit (INDEPENDENT_AMBULATORY_CARE_PROVIDER_SITE_OTHER): Payer: BC Managed Care – PPO | Admitting: Cardiology

## 2021-02-05 ENCOUNTER — Encounter: Payer: Self-pay | Admitting: Cardiology

## 2021-02-05 ENCOUNTER — Other Ambulatory Visit: Payer: Self-pay

## 2021-02-05 VITALS — BP 104/58 | HR 71 | Ht 64.0 in | Wt 119.6 lb

## 2021-02-05 DIAGNOSIS — I77819 Aortic ectasia, unspecified site: Secondary | ICD-10-CM | POA: Diagnosis not present

## 2021-02-05 DIAGNOSIS — I712 Thoracic aortic aneurysm, without rupture, unspecified: Secondary | ICD-10-CM

## 2021-02-05 DIAGNOSIS — I7121 Aneurysm of the ascending aorta, without rupture: Secondary | ICD-10-CM | POA: Diagnosis not present

## 2021-02-05 MED ORDER — NONFORMULARY OR COMPOUNDED ITEM: 3 refills | Status: DC

## 2021-02-05 NOTE — Patient Instructions (Addendum)
Medication Instructions:  Your physician recommends that you continue on your current medications as directed. Please refer to the Current Medication list given to you today.  *If you need a refill on your cardiac medications before your next appointment, please call your pharmacy*   Testing/Procedures: Your provider has recommended that you have a gated chest CTA scan or a cardiac MRI/MRA of the chest.   Follow-Up: At [facility], you and your health needs are our priority.  As part of our continuing mission to provide you with exceptional heart care, we have created designated Provider Care Teams.  These Care Teams include your primary Cardiologist (physician) and Advanced Practice Providers (APPs -  Physician Assistants and Nurse Practitioners) who all work together to provide you with the care you need, when you need it.   Follow up with Dr. Radford Pax as needed based on results of testing.

## 2021-02-05 NOTE — Addendum Note (Signed)
Addended by: Antonieta Iba on: 02/05/2021 03:39 PM   Modules accepted: Orders

## 2021-02-05 NOTE — Progress Notes (Signed)
Cardiology CONSULT Note    Date:  02/05/2021   ID:  Sarah Mclean, DOB 26-Jan-1961, MRN [id number]  PCP:  Glenis Smoker, MD  Cardiologist:  Fransico Him, MD   Chief Complaint  Patient presents with   New Patient (Initial Visit)    Dilated aorta    History of Present Illness:  Sarah Mclean is a 60 y.o. female who is being seen today for the evaluation of dilated ascending thoracic aorta at the request of Glenis Smoker, *.  This is a 60yo female with a hx of GERD, HLD, preDM who had a recent coronary Ca score done for risk assessment.  This showed a coronary Ca score of 0 but did show ascending aortic measurement of 3.6cm and felt borderline enlarged based on size of descending aorta.  She denies any chest pain or pressure, SOB, DOE, PND, orthopnea, LE edema, or syncope. She occasionally will notice a flip flop of her heart. She has chronic problems with dizziness since her 3's.  She is compliant with her meds and is tolerating meds with no SE.     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   estradiol (ESTRACE) 0.1 MG/GM vaginal cream Place 1 Applicatorful vaginally 2 (two) times a week.   Multiple Vitamin (MULTIVITAMIN) tablet Take 1 tablet by mouth daily.   NONFORMULARY OR COMPOUNDED ITEM Testosterone cream 2% apply pea size amount to  clitoris   Omega-3 Fatty Acids (OMEGA 3 PO) Take by mouth.   OVER THE COUNTER MEDICATION osteosheath   OVER THE COUNTER MEDICATION Adrenal forte and a-fbetafood   OVER THE COUNTER MEDICATION A-F BETA FOOD-  GALLBLADDER HEALTH   OVER THE COUNTER MEDICATION DIGESTIN    Allergies:   Hydrocodone-acetaminophen, Latex, Vicodin [hydrocodone-acetaminophen], Erythromycin, and Penicillins   Social History   Socioeconomic History   Marital status: Married    Spouse name: Eddie Dibbles   Number of children: 3   Years of education: 12+4    Highest education level: Not on file  Occupational History    Employer: UNEMPLOYED  Tobacco Use   Smoking status: Never   Smokeless tobacco: Never  Vaping Use   Vaping Use: Never used  Substance and Sexual Activity   Alcohol use: Yes    Alcohol/week: 0.0 standard drinks    Comment: rare   Drug use: No   Sexual activity: Yes    Partners: Male    Birth control/protection: Post-menopausal    Comment: intercourse age 66, less than 5 sexual partners, des neg  Other Topics Concern   Not on file  Social History Narrative   Patient is married Eddie Dibbles) and lives at home  with her husband and three children.   Patient has two biological children and one adopted.   Patient drinks one soda on most days.   Social Determinants of Health   Financial Resource Strain: Not on file  Food Insecurity: Not on file  Transportation Needs: Not on file  Physical Activity: Not on file  Stress: Not on file  Social Connections: Not on file     Family History:  The patient's family history includes Breast cancer in her sister; Cancer in her mother and sister; Colon cancer in her sister.   ROS:   Please see the history of present illness.    ROS All other systems reviewed and are negative.  No flowsheet data found.     PHYSICAL EXAM:   VS:  BP (!) 104/58   Pulse 71   Ht 5\' 4"  (1.626 m)   Wt 119 lb 9.6 oz (54.3 kg)   SpO2 98%   BMI 20.53 kg/m    GEN: Well nourished,  well developed, in no acute distress  HEENT: normal  Neck: no JVD, carotid bruits, or masses Cardiac: RRR; no murmurs, rubs, or gallops,no edema.  Intact distal pulses bilaterally.  Respiratory:  clear to auscultation bilaterally, normal work of breathing GI: soft, nontender, nondistended, + BS MS: no deformity or atrophy  Skin: warm and dry, no rash Neuro:  Alert and Oriented x 3, Strength and sensation are intact Psych: euthymic mood, full affect  Wt Readings from Last 3 Encounters:  02/05/21 119 lb 9.6 oz (54.3 kg)  03/27/20 123 lb (55.8 kg)  11/12/18 120 lb (54.4 kg)      Studies/Labs Reviewed:   EKG:  EKG is ordered today.  The ekg ordered today demonstrates NSR with with no ST changes  Recent Labs: No results found for requested labs within last 8760 hours.   Lipid Panel    Component Value Date/Time   CHOL 159 07/23/2015 1453   TRIG 112 07/23/2015 1453   HDL 59 07/23/2015 1453   CHOLHDL 2.7 07/23/2015 1453   VLDL 22 07/23/2015 1453   LDLCALC 78 07/23/2015 1453    Additional studies/ records that were reviewed today include:  Coronary Ca score and CT    ASSESSMENT:    1. Acquired dilation of ascending aorta and aortic root (HCC)      PLAN:  In order of problems listed above:  Borderline dilatation of the ascending aorta -this measured 3.6cm on CT but this was not a gated CTA.  This was also a comparison made with the descending aorta -she has no family hx of aneurysms or AV disease -I will get a gated Chest CTA to get a more accurate assessment of aortic dimensions>>she would like to know what the cost difference would be between the chest CTA and an MRI MRA of the chest.  -2D echo done 10/2020 showed normal LVF with trivial MR and mild AVSC and ascending aorta measured 3.7cm  Time Spent: 20 minutes total time of encounter, including 15 minutes spent in face-to-face patient care on the date of this encounter. This time includes coordination of care and  counseling regarding above mentioned problem list. Remainder of non-face-to-face time involved reviewing chart documents/testing relevant to the patient encounter and documentation in the medical record. I have independently reviewed documentation from referring provider  Medication Adjustments/Labs and Tests Ordered: Current medicines are reviewed at length with the patient today.  Concerns regarding medicines are outlined above.  Medication changes, Labs and Tests ordered today  are listed in the Patient Instructions below.  There are no Patient Instructions on file for this visit.   Signed, Fransico Him, MD  02/05/2021 3:24 PM    [facility] [address], [place], [place]  [address] Phone: [phone]; Fax: [phone]

## 2021-02-05 NOTE — Telephone Encounter (Signed)
Called into pharmacy

## 2021-02-12 ENCOUNTER — Telehealth: Payer: Self-pay | Admitting: Cardiology

## 2021-02-12 NOTE — Telephone Encounter (Signed)
Follow Up:     Patient she was waiting to find out what the MRI was going to cost. She said somebody was supposed to call with the amount.

## 2021-02-19 DIAGNOSIS — F4320 Adjustment disorder, unspecified: Secondary | ICD-10-CM | POA: Diagnosis not present

## 2021-03-14 ENCOUNTER — Telehealth: Payer: Self-pay | Admitting: Cardiology

## 2021-03-14 NOTE — Telephone Encounter (Signed)
Pt Is returning call to Caren Griffins at ext #[phone] regarding whether she should schedule a MRI or CT Scan. Please advise pt further

## 2021-03-15 DIAGNOSIS — E7800 Pure hypercholesterolemia, unspecified: Secondary | ICD-10-CM | POA: Diagnosis not present

## 2021-03-15 DIAGNOSIS — Z0000 Encounter for general adult medical examination without abnormal findings: Secondary | ICD-10-CM | POA: Diagnosis not present

## 2021-03-15 DIAGNOSIS — M8580 Other specified disorders of bone density and structure, unspecified site: Secondary | ICD-10-CM | POA: Diagnosis not present

## 2021-03-15 NOTE — Telephone Encounter (Signed)
Pt returning call... would like to know if you would advise MRI or CT w/ contrast.

## 2021-03-18 NOTE — Telephone Encounter (Signed)
Dr. Radford Pax recommended CT scan. Patient has been scheduled.

## 2021-03-26 DIAGNOSIS — F4320 Adjustment disorder, unspecified: Secondary | ICD-10-CM | POA: Diagnosis not present

## 2021-03-27 ENCOUNTER — Encounter (HOSPITAL_COMMUNITY): Payer: Self-pay

## 2021-03-27 ENCOUNTER — Ambulatory Visit (HOSPITAL_COMMUNITY)
Admission: RE | Admit: 2021-03-27 | Discharge: 2021-03-27 | Disposition: A | Discharge status: Home / Self Care | Payer: BC Managed Care – PPO | Source: Ambulatory Visit | Attending: Cardiology | Admitting: Cardiology

## 2021-03-27 ENCOUNTER — Other Ambulatory Visit: Payer: Self-pay

## 2021-03-27 DIAGNOSIS — I712 Thoracic aortic aneurysm, without rupture, unspecified: Secondary | ICD-10-CM | POA: Diagnosis not present

## 2021-03-27 MED ORDER — IOHEXOL 350 MG/ML SOLN
80.0000 mL | Freq: Once | INTRAVENOUS | Status: AC | PRN
  Administered 2021-03-27: 80 mL via INTRAVENOUS

## 2021-03-28 ENCOUNTER — Ambulatory Visit (INDEPENDENT_AMBULATORY_CARE_PROVIDER_SITE_OTHER): Payer: BC Managed Care – PPO | Admitting: Obstetrics & Gynecology

## 2021-03-28 ENCOUNTER — Other Ambulatory Visit (HOSPITAL_COMMUNITY)
Admission: RE | Admit: 2021-03-28 | Discharge: 2021-03-28 | Disposition: A | Discharge status: Home / Self Care | Payer: BC Managed Care – PPO | Source: Ambulatory Visit | Attending: Obstetrics & Gynecology | Admitting: Obstetrics & Gynecology

## 2021-03-28 ENCOUNTER — Encounter: Payer: Self-pay | Admitting: Cardiology

## 2021-03-28 ENCOUNTER — Other Ambulatory Visit: Payer: Self-pay

## 2021-03-28 VITALS — BP 112/78 | HR 89 | Resp 16 | Ht 63.75 in | Wt 116.0 lb

## 2021-03-28 DIAGNOSIS — Z01419 Encounter for gynecological examination (general) (routine) without abnormal findings: Secondary | ICD-10-CM | POA: Insufficient documentation

## 2021-03-28 DIAGNOSIS — N952 Postmenopausal atrophic vaginitis: Secondary | ICD-10-CM | POA: Diagnosis not present

## 2021-03-28 DIAGNOSIS — R6882 Decreased libido: Secondary | ICD-10-CM

## 2021-03-28 DIAGNOSIS — Z780 Asymptomatic menopausal state: Secondary | ICD-10-CM

## 2021-03-28 DIAGNOSIS — M8589 Other specified disorders of bone density and structure, multiple sites: Secondary | ICD-10-CM

## 2021-03-28 MED ORDER — NONFORMULARY OR COMPOUNDED ITEM: 3 refills | Status: DC

## 2021-03-28 MED ORDER — ESTRADIOL 0.1 MG/GM VA CREA: 1.0000 | TOPICAL_CREAM | VAGINAL | 4 refills | Status: DC

## 2021-03-28 NOTE — Progress Notes (Signed)
  Sarah Mclean 06/25/1960 [id number]   History:    60 y.o. G6P2A4L2 (1 adopted)  Married.     RP:  Established patient presenting for annual gyn exam    HPI: Postmenopause in her 40's, no HRT.  No PMB. No pelvic pain.  No pain/dryness with IC using Estrace cream.  Continued low libido. Pap Neg 11/03/2017.  Pap reflex today.  Last BD 8/6/2020showed Osteopenia at multiple sites, lowest T-Score -2.4 at the Spine.  Patient decided not to start on medication, but instead improve her nutrition and weight bearing physical activity. Taking a Bone Supplement with Vit D.  Urine/BMs wnl. Breasts normal.  Will obtain Mammo report 12/2020.  Benign Breast Bxs per patient. BMI 20.07.  Health labs with Fam MD.  Sister with Breast Ca.  Sister had genetic testing, BrCa 1-2 negative.  Health labs with Fam MD.  COLONOSCOPY: 2018  Past medical history,surgical history, family history and social history were all reviewed and documented in the EPIC chart.  Gynecologic History No LMP recorded. Patient is postmenopausal.  Obstetric History OB History  Gravida Para Term Preterm AB Living  6 2 2   4 2  SAB IAB Ectopic Multiple Live Births  4            # Outcome Date GA Lbr Len/2nd Weight Sex Delivery Anes PTL Lv  6 SAB           5 SAB           4 SAB           3 SAB           2 Term           1 Term              ROS: A ROS was performed and pertinent positives and negatives are included in the history.  GENERAL: No fevers or chills. HEENT: No change in vision, no earache, sore throat or sinus congestion. NECK: No pain or stiffness. CARDIOVASCULAR: No chest pain or pressure. No palpitations. PULMONARY: No shortness of breath, cough or wheeze. GASTROINTESTINAL: No abdominal pain, nausea, vomiting or diarrhea, melena or bright red blood per rectum. GENITOURINARY: No urinary frequency, urgency, hesitancy or dysuria. MUSCULOSKELETAL: No joint or muscle pain, no back pain, no recent trauma. DERMATOLOGIC: No rash,  no itching, no lesions. ENDOCRINE: No polyuria, polydipsia, no heat or cold intolerance. No recent change in weight. HEMATOLOGICAL: No anemia or easy bruising or bleeding. NEUROLOGIC: No headache, seizures, numbness, tingling or weakness. PSYCHIATRIC: No depression, no loss of interest in normal activity or change in sleep pattern.     Exam:   BP 112/78   Pulse 89   Resp 16   Ht 5' 3.75" (1.619 m)   Wt 116 lb (52.6 kg)   BMI 20.07 kg/m   Body mass index is 20.07 kg/m.  General appearance : Well developed well nourished female. No acute distress HEENT: Eyes: no retinal hemorrhage or exudates,  Neck supple, trachea midline, no carotid bruits, no thyroidmegaly Lungs: Clear to auscultation, no rhonchi or wheezes, or rib retractions  Heart: Regular rate and rhythm, no murmurs or gallops Breast:Examined in sitting and supine position were symmetrical in appearance, no palpable masses or tenderness,  no skin retraction, no nipple inversion, no nipple discharge, no skin discoloration, no axillary or supraclavicular lymphadenopathy Abdomen: no palpable masses or tenderness, no rebound or guarding Extremities: no edema or skin discoloration or tenderness  Pelvic: Vulva:   Normal             Vagina: No gross lesions or discharge  Cervix: No gross lesions or discharge.  Pap reflex done.  Uterus  AV, normal size, shape and consistency, non-tender and mobile  Adnexa  Without masses or tenderness  Anus: Normal   Assessment/Plan:  60 y.o. female for annual exam   1. Well female exam with routine gynecological exam Postmenopause in her 40's, no HRT.  No PMB. No pelvic pain.  No pain/dryness with IC using Estrace cream.  Continued low libido on Testosterone. Pap Neg 11/03/2017.  Pap reflex today.  Last BD 8/6/2020showed Osteopenia at multiple sites, lowest T-Score -2.4 at the Spine.  Patient decided not to start on medication, but instead improve her nutrition and weight bearing physical activity.  Taking a Bone Supplement with Vit D.  Urine/BMs wnl. Breasts normal.  Will obtain Mammo report 12/2020.  Benign Breast Bxs per patient. BMI 20.07.  Health labs with Fam MD.  Sister with Breast Ca.  Sister had genetic testing, BrCa 1-2 negative.  Health labs with Fam MD.  COLONOSCOPY: 2018 - Cytology - PAP( [facility])  2. Postmenopause Postmenopause in her 40's, no HRT.  No PMB. No pelvic pain.  No pain/dryness with IC using Estrace cream.    3. Post-menopausal atrophic vaginitis  No pain/dryness with IC using Estrace cream.  Estrace cream prescription sent to pharmacy.  4. Low libido Will continue on Testosterone cream.  Will send prescription to [facility].  5. Osteopenia of multiple sites Last BD 12/02/2018 showed Osteopenia at multiple sites, lowest T-Score -2.4 at the Spine.  Patient decided not to start on medication, but instead improve her nutrition and weight bearing physical activity. Taking a Bone Supplement with Vit D.   - DG Bone Density; Future  Other orders - UNABLE TO FIND; Para plus 1 week a month - Cholecalciferol (VITAMIN D3) 125 MCG (5000 UT) CAPS; 1 tablet - Magnesium Malate 1250 (141.7 Mg) MG TABS; 1 tablet - estradiol (ESTRACE) 0.1 MG/GM vaginal cream; Place 1 Applicatorful vaginally 2 (two) times a week.   Marie-Lyne Lavoie MD, 3:04 PM 03/28/2021    

## 2021-03-28 NOTE — Telephone Encounter (Signed)
Testo prescription to [facility] Received: Today [name], MD  [facility]  [facility] Triage  Low libido

## 2021-03-29 ENCOUNTER — Ambulatory Visit: Payer: Self-pay

## 2021-03-29 ENCOUNTER — Ambulatory Visit (INDEPENDENT_AMBULATORY_CARE_PROVIDER_SITE_OTHER): Payer: BC Managed Care – PPO | Admitting: Orthopedic Surgery

## 2021-03-29 ENCOUNTER — Other Ambulatory Visit: Payer: Self-pay

## 2021-03-29 ENCOUNTER — Encounter: Payer: Self-pay | Admitting: Orthopedic Surgery

## 2021-03-29 DIAGNOSIS — M546 Pain in thoracic spine: Secondary | ICD-10-CM | POA: Diagnosis not present

## 2021-03-29 DIAGNOSIS — M79672 Pain in left foot: Secondary | ICD-10-CM | POA: Diagnosis not present

## 2021-03-29 DIAGNOSIS — M542 Cervicalgia: Secondary | ICD-10-CM

## 2021-03-29 NOTE — Progress Notes (Signed)
Office Visit Note   Patient: Sarah Mclean           Date of Birth: 15-Jan-1961           MRN: [id number] Visit Date: 03/29/2021 Requested by: Glenis Smoker, MD [place],  [place] [address] PCP: Glenis Smoker, MD  Subjective: Chief Complaint  Patient presents with   Neck - Pain   Middle Back - Pain   Foot Pain    HPI: [name] is a patient who presents for evaluation of neck and back pain as well as left foot pain.  She also reports bilateral arm numbness and tingling.  Patient describes extremely episodic numbness and tingling in her arms which affects primarily the palmar aspect of her hands.  Denies much in the way of neck pain.  The symptoms come and go.  Does not wake the patient from sleep and she denies any loss of dexterity in her hands.  She has had a cardiac work-up which was negative.  She reports some symptoms when she is sleeping with her arms flexed.  Does not take any medication.  Denies any recent scapular pain.  Patient also reports pain in the middle of her thoracic spine which started a week ago after doing an extensive jump on the trampoline.  She does have a history of osteopenia.  Denies any leg symptoms as result of that.  Patient also reports left foot pain which has been on and off for years which she localizes between the third and fourth metatarsal head.  Reports some pain in the dorsal and lateral aspect of the foot.  She states her back problems getting better.  The foot problem is very episodic but does hurt focally in that area.  Does not report any numbness in her toes.  Does not take any medication for any of the problems.  She is very active with working out.             ROS: All systems reviewed are negative as they relate to the chief complaint within the history of present illness.  Patient denies  fevers or chills.   Assessment & Plan: Visit Diagnoses:  1. Neck pain   2. Pain in left foot   3. Pain in thoracic  spine     Plan: Impression is T9 compression fracture which is mild.  She is actually feeling better 1 week out from injury.  Nothing she needs to be very careful with any type of resistive weight lifting over the next 2 to 3 weeks until she is completely asymptomatic from her back.  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  Regarding the numbness and tingling in her arms I think this could be coming from that C5 disc in her neck.  Bilateral symptoms will be a little bit more uncommon and could also relate to underlying carpal tunnel syndrome or cubital tunnel syndrome although her ulnar nerve not particularly symptomatic at this time.  That something is not really symptomatic enough to work-up but if it gets worse then MRI scanning of her neck as well as nerve conduction study to evaluate for cubital tunnel and carpal tunnel syndrome is indicated.  Regarding left foot pain no fracture or stress reaction visible on plain radiographs.  Location of the pain is classic for Morton's neuroma.  She is not having really classic symptoms for Morton's neuroma but I do not see any type of significant pathology either on exam  or radiographs.  That something we can watch as well and consider further imaging if her symptoms worsen.  Follow-Up Instructions: No follow-ups on file.   Orders:  Orders Placed This Encounter  Procedures   XR Cervical Spine 2 or 3 views   XR Foot Complete Left   XR Thoracic Spine 2 View   No orders of the defined types were placed in this encounter.     Procedures: No procedures performed   Clinical Data: No additional findings.  Objective: Vital Signs: There were no vitals taken for this visit.  Physical Exam:   Constitutional: Patient appears well-developed HEENT:  Head: Normocephalic Eyes:EOM are normal Neck: Normal range of motion Cardiovascular: Normal rate Pulmonary/chest: Effort normal Neurologic: Patient is alert Skin: Skin is  warm Psychiatric: Patient has normal mood and affect   Ortho Exam: Ortho exam demonstrates full active and passive range of motion of the cervical spine.  5 out of 5 grip EPL FPL interosseous wrist flexion extension bicep triceps and deltoid strength with palpable pedal pulses.  She has negative clonus.  She has mild pain in the thoracic spine with forward flexion but its not bad.  No real focal tenderness is present.  Negative Tinel's cubital tunnel in the elbow no compression with carpal tunnel compression testing or Phalen's testing.  Radial pulses intact bilaterally.  Left foot has tenderness between the third and fourth metatarsal heads.  Toe extension and flexion intact.  Palpable intact nontender anterior to posterior to peroneal and Achilles tendons.  No other masses lymphadenopathy or skin changes noted in the foot region the elbow region of the back region.  Specialty Comments:  No specialty comments available.  Imaging: XR Foot Complete Left  Result Date: 03/29/2021 AP lateral oblique radiographs left foot reviewed.  No acute fracture.  Tarsometatarsal alignment intact.  No significant arthritis around the MTP or inner phalangeal joints.  XR Thoracic Spine 2 View  Result Date: 03/29/2021 AP lateral thoracic spine radiographs reviewed.  Mild compression fracture noted at T9 with less than 10% loss of vertebral body height.  No adjacent segment compression fractures.  No retropulsion of fragments.  XR Cervical Spine 2 or 3 views  Result Date: 03/29/2021 AP lateral cervical spine radiographs reviewed.  Mild to moderate degenerative disc disease is present at C5-6.  Lordosis maintained.  Mild facet arthritis present at that level as well.  No acute fracture or spondylolisthesis.  No significant spurring.    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   Dilation of aorta (HCC)    Normal by chest CTA 03-2021   GERD (gastroesophageal reflux disease)    Hypercholesterolemia    Menopausal state    age 47 onset   Osteopenia    Osteoporosis    Parasomnia    Prediabetes    Rosacea    Vertigo    Vitamin D deficiency     Family History  Problem Relation Age of Onset   Cancer Mother  sarcoma   Colon cancer Sister    Cancer Sister        colon   Breast cancer Sister     Past Surgical History:  Procedure Laterality Date   CESAREAN SECTION     DILATION AND CURETTAGE OF UTERUS  2004   suction curettage   HEMORRHOID SURGERY     KNEE ARTHROSCOPY     RHINOPLASTY     TONSILLECTOMY     Social History   Occupational History    Employer: UNEMPLOYED  Tobacco Use   Smoking status: Never   Smokeless tobacco: Never  Vaping Use   Vaping Use: Never used  Substance and Sexual Activity   Alcohol use: Not Currently   Drug use: No   Sexual activity: Yes    Partners: Male    Birth control/protection: Post-menopausal    Comment: intercourse age 51, less than 5 sexual partners, des neg

## 2021-04-01 LAB — CYTOLOGY - PAP: Diagnosis: NEGATIVE

## 2021-04-08 ENCOUNTER — Encounter: Payer: Self-pay | Admitting: Orthopedic Surgery

## 2021-04-09 DIAGNOSIS — F4320 Adjustment disorder, unspecified: Secondary | ICD-10-CM | POA: Diagnosis not present

## 2021-04-13 DIAGNOSIS — Z20822 Contact with and (suspected) exposure to covid-19: Secondary | ICD-10-CM | POA: Diagnosis not present

## 2021-04-14 DIAGNOSIS — M791 Myalgia, unspecified site: Secondary | ICD-10-CM | POA: Diagnosis not present

## 2021-04-14 DIAGNOSIS — R051 Acute cough: Secondary | ICD-10-CM | POA: Diagnosis not present

## 2021-04-14 DIAGNOSIS — Z20822 Contact with and (suspected) exposure to covid-19: Secondary | ICD-10-CM | POA: Diagnosis not present

## 2021-04-14 DIAGNOSIS — R519 Headache, unspecified: Secondary | ICD-10-CM | POA: Diagnosis not present

## 2021-05-08 ENCOUNTER — Other Ambulatory Visit: Payer: Self-pay

## 2021-05-08 ENCOUNTER — Ambulatory Visit: Payer: Self-pay

## 2021-05-08 ENCOUNTER — Ambulatory Visit (INDEPENDENT_AMBULATORY_CARE_PROVIDER_SITE_OTHER): Payer: BC Managed Care – PPO | Admitting: Orthopedic Surgery

## 2021-05-08 DIAGNOSIS — M546 Pain in thoracic spine: Secondary | ICD-10-CM | POA: Diagnosis not present

## 2021-05-12 ENCOUNTER — Encounter: Payer: Self-pay | Admitting: Orthopedic Surgery

## 2021-05-12 NOTE — Progress Notes (Signed)
Office Visit Note   Patient: Sarah Mclean           Date of Birth: 1961/04/01           MRN: [id number] Visit Date: 05/08/2021 Requested by: Glenis Smoker, MD [place],  [place] [address] PCP: Glenis Smoker, MD  Subjective: Chief Complaint  Patient presents with   Middle Back - Pain    HPI: Sarah Mclean is a patient with T9 compression fracture.  She is 7-1/2 weeks out.  She was doing well until she returned to work this past week.  Has a little bit of achiness midday.  Has a little bit more right central pain as opposed to left central pain.  She does do movement teaching for preschoolers.  She has been doing yoga as well.              ROS: All systems reviewed are negative as they relate to the chief complaint within the history of present illness.  Patient denies  fevers or chills.   Assessment & Plan: Visit Diagnoses:  1. Pain in thoracic spine     Plan: Impression is unchanged radiographic thoracic compression fracture.  I think essentially the fracture is healed at this time.  Due to her history of osteopenia/osteoporosis she will need to be careful with lifting in vigorous types of activities.  Trying physical therapy for stretching and guide to return to activities could be helpful.  Anticipate full resolution of symptoms likely within the next 3 to 4 weeks.  Follow-up as needed  Follow-Up Instructions: Return if symptoms worsen or fail to improve.   Orders:  Orders Placed This Encounter  Procedures   XR Thoracic Spine 2 View   No orders of the defined types were placed in this encounter.     Procedures: No procedures performed   Clinical Data: No additional findings.  Objective: Vital Signs: There were no vitals taken for this visit.  Physical Exam:   Constitutional: Patient appears well-developed HEENT:  Head: Normocephalic Eyes:EOM are normal Neck: Normal range of motion Cardiovascular: Normal rate Pulmonary/chest:  Effort normal Neurologic: Patient is alert Skin: Skin is warm Psychiatric: Patient has normal mood and affect   Ortho Exam: Ortho exam demonstrates full active and passive range of motion of the lower extremities.  She has good forward flexion strength and motion.  Little bit of tenderness more on the right-hand side of the mid thoracic spine compared to the left.  Not too much in the way of spasm.  Specialty Comments:  No specialty comments available.  Imaging: No results found.   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   Dilation of aorta (HCC)    Normal by chest CTA 03-2021   GERD (gastroesophageal reflux disease)    Hypercholesterolemia    Menopausal state    age 66 onset   Osteopenia    Osteoporosis    Parasomnia    Prediabetes    Rosacea    Vertigo  Vitamin D deficiency     Family History  Problem Relation Age of Onset   Cancer Mother        sarcoma   Colon cancer Sister    Cancer Sister        colon   Breast cancer Sister     Past Surgical History:  Procedure Laterality Date   CESAREAN SECTION     DILATION AND CURETTAGE OF UTERUS  2004   suction curettage   HEMORRHOID SURGERY     KNEE ARTHROSCOPY     RHINOPLASTY     TONSILLECTOMY     Social History   Occupational History    Employer: UNEMPLOYED  Tobacco Use   Smoking status: Never   Smokeless tobacco: Never  Vaping Use   Vaping Use: Never used  Substance and Sexual Activity   Alcohol use: Not Currently   Drug use: No   Sexual  activity: Yes    Partners: Male    Birth control/protection: Post-menopausal    Comment: intercourse age 38, less than 5 sexual partners, des neg

## 2021-05-14 DIAGNOSIS — M546 Pain in thoracic spine: Secondary | ICD-10-CM | POA: Diagnosis not present

## 2021-05-21 ENCOUNTER — Other Ambulatory Visit: Payer: Self-pay

## 2021-05-21 ENCOUNTER — Other Ambulatory Visit: Payer: Self-pay | Admitting: Obstetrics & Gynecology

## 2021-05-21 ENCOUNTER — Ambulatory Visit (INDEPENDENT_AMBULATORY_CARE_PROVIDER_SITE_OTHER): Payer: BC Managed Care – PPO

## 2021-05-21 DIAGNOSIS — M810 Age-related osteoporosis without current pathological fracture: Secondary | ICD-10-CM

## 2021-05-21 DIAGNOSIS — Z780 Asymptomatic menopausal state: Secondary | ICD-10-CM

## 2021-05-21 DIAGNOSIS — M8589 Other specified disorders of bone density and structure, multiple sites: Secondary | ICD-10-CM

## 2021-05-22 ENCOUNTER — Encounter: Payer: Self-pay | Admitting: Obstetrics & Gynecology

## 2021-05-23 DIAGNOSIS — F4320 Adjustment disorder, unspecified: Secondary | ICD-10-CM | POA: Diagnosis not present

## 2021-05-29 DIAGNOSIS — J029 Acute pharyngitis, unspecified: Secondary | ICD-10-CM | POA: Diagnosis not present

## 2021-05-29 DIAGNOSIS — Z20822 Contact with and (suspected) exposure to covid-19: Secondary | ICD-10-CM | POA: Diagnosis not present

## 2021-06-07 ENCOUNTER — Telehealth: Payer: Self-pay | Admitting: *Deleted

## 2021-06-07 NOTE — Telephone Encounter (Signed)
Patient called requesting copy of recent dexa. Copy placed at front desk for pick up.

## 2021-06-17 ENCOUNTER — Encounter: Payer: Self-pay | Admitting: Orthopedic Surgery

## 2021-06-19 DIAGNOSIS — F4320 Adjustment disorder, unspecified: Secondary | ICD-10-CM | POA: Diagnosis not present

## 2021-06-28 DIAGNOSIS — R739 Hyperglycemia, unspecified: Secondary | ICD-10-CM | POA: Diagnosis not present

## 2021-06-28 DIAGNOSIS — Z13220 Encounter for screening for lipoid disorders: Secondary | ICD-10-CM | POA: Diagnosis not present

## 2021-06-28 DIAGNOSIS — M810 Age-related osteoporosis without current pathological fracture: Secondary | ICD-10-CM | POA: Diagnosis not present

## 2021-06-28 DIAGNOSIS — E039 Hypothyroidism, unspecified: Secondary | ICD-10-CM | POA: Diagnosis not present

## 2021-06-28 DIAGNOSIS — L719 Rosacea, unspecified: Secondary | ICD-10-CM | POA: Diagnosis not present

## 2021-06-28 DIAGNOSIS — L820 Inflamed seborrheic keratosis: Secondary | ICD-10-CM | POA: Diagnosis not present

## 2021-06-28 DIAGNOSIS — D696 Thrombocytopenia, unspecified: Secondary | ICD-10-CM | POA: Diagnosis not present

## 2021-06-28 DIAGNOSIS — L570 Actinic keratosis: Secondary | ICD-10-CM | POA: Diagnosis not present

## 2021-06-28 DIAGNOSIS — E559 Vitamin D deficiency, unspecified: Secondary | ICD-10-CM | POA: Diagnosis not present

## 2021-07-15 DIAGNOSIS — L814 Other melanin hyperpigmentation: Secondary | ICD-10-CM | POA: Diagnosis not present

## 2021-07-15 DIAGNOSIS — L821 Other seborrheic keratosis: Secondary | ICD-10-CM | POA: Diagnosis not present

## 2021-07-15 DIAGNOSIS — D225 Melanocytic nevi of trunk: Secondary | ICD-10-CM | POA: Diagnosis not present

## 2021-07-15 DIAGNOSIS — Z86018 Personal history of other benign neoplasm: Secondary | ICD-10-CM | POA: Diagnosis not present

## 2021-07-18 DIAGNOSIS — F4320 Adjustment disorder, unspecified: Secondary | ICD-10-CM | POA: Diagnosis not present

## 2021-09-19 DIAGNOSIS — F4320 Adjustment disorder, unspecified: Secondary | ICD-10-CM | POA: Diagnosis not present

## 2021-09-20 DIAGNOSIS — D899 Disorder involving the immune mechanism, unspecified: Secondary | ICD-10-CM | POA: Diagnosis not present

## 2021-09-20 DIAGNOSIS — Z79890 Hormone replacement therapy (postmenopausal): Secondary | ICD-10-CM | POA: Diagnosis not present

## 2021-09-20 DIAGNOSIS — R7982 Elevated C-reactive protein (CRP): Secondary | ICD-10-CM | POA: Diagnosis not present

## 2021-09-20 DIAGNOSIS — E7211 Homocystinuria: Secondary | ICD-10-CM | POA: Diagnosis not present

## 2021-09-25 DIAGNOSIS — F4320 Adjustment disorder, unspecified: Secondary | ICD-10-CM | POA: Diagnosis not present

## 2021-10-23 DIAGNOSIS — F4320 Adjustment disorder, unspecified: Secondary | ICD-10-CM | POA: Diagnosis not present

## 2021-11-13 DIAGNOSIS — R031 Nonspecific low blood-pressure reading: Secondary | ICD-10-CM | POA: Diagnosis not present

## 2021-11-13 DIAGNOSIS — T7840XA Allergy, unspecified, initial encounter: Secondary | ICD-10-CM | POA: Diagnosis not present

## 2021-11-13 DIAGNOSIS — S50861A Insect bite (nonvenomous) of right forearm, initial encounter: Secondary | ICD-10-CM | POA: Diagnosis not present

## 2021-11-13 DIAGNOSIS — Z23 Encounter for immunization: Secondary | ICD-10-CM | POA: Diagnosis not present

## 2021-11-19 DIAGNOSIS — H524 Presbyopia: Secondary | ICD-10-CM | POA: Diagnosis not present

## 2021-11-19 DIAGNOSIS — H11003 Unspecified pterygium of eye, bilateral: Secondary | ICD-10-CM | POA: Diagnosis not present

## 2022-01-08 DIAGNOSIS — Z79890 Hormone replacement therapy (postmenopausal): Secondary | ICD-10-CM | POA: Diagnosis not present

## 2022-01-08 DIAGNOSIS — M810 Age-related osteoporosis without current pathological fracture: Secondary | ICD-10-CM | POA: Diagnosis not present

## 2022-01-08 DIAGNOSIS — N951 Menopausal and female climacteric states: Secondary | ICD-10-CM | POA: Diagnosis not present

## 2022-01-09 DIAGNOSIS — S0101XA Laceration without foreign body of scalp, initial encounter: Secondary | ICD-10-CM | POA: Diagnosis not present

## 2022-01-10 DIAGNOSIS — Z1231 Encounter for screening mammogram for malignant neoplasm of breast: Secondary | ICD-10-CM | POA: Diagnosis not present

## 2022-01-13 ENCOUNTER — Encounter: Payer: Self-pay | Admitting: Obstetrics & Gynecology

## 2022-01-13 DIAGNOSIS — S0101XD Laceration without foreign body of scalp, subsequent encounter: Secondary | ICD-10-CM | POA: Diagnosis not present

## 2022-01-29 DIAGNOSIS — Z62820 Parent-biological child conflict: Secondary | ICD-10-CM | POA: Diagnosis not present

## 2022-01-29 DIAGNOSIS — Z719 Counseling, unspecified: Secondary | ICD-10-CM | POA: Diagnosis not present

## 2022-01-29 DIAGNOSIS — F4320 Adjustment disorder, unspecified: Secondary | ICD-10-CM | POA: Diagnosis not present

## 2022-02-24 DIAGNOSIS — Z0130 Encounter for examination of blood pressure without abnormal findings: Secondary | ICD-10-CM | POA: Diagnosis not present

## 2022-02-24 DIAGNOSIS — E039 Hypothyroidism, unspecified: Secondary | ICD-10-CM | POA: Diagnosis not present

## 2022-02-26 DIAGNOSIS — F4320 Adjustment disorder, unspecified: Secondary | ICD-10-CM | POA: Diagnosis not present

## 2022-03-12 DIAGNOSIS — Z79890 Hormone replacement therapy (postmenopausal): Secondary | ICD-10-CM | POA: Diagnosis not present

## 2022-03-12 DIAGNOSIS — E039 Hypothyroidism, unspecified: Secondary | ICD-10-CM | POA: Diagnosis not present

## 2022-03-12 DIAGNOSIS — E7211 Homocystinuria: Secondary | ICD-10-CM | POA: Diagnosis not present

## 2022-03-12 DIAGNOSIS — M810 Age-related osteoporosis without current pathological fracture: Secondary | ICD-10-CM | POA: Diagnosis not present

## 2022-03-14 ENCOUNTER — Ambulatory Visit (INDEPENDENT_AMBULATORY_CARE_PROVIDER_SITE_OTHER): Payer: BC Managed Care – PPO | Admitting: Surgical

## 2022-03-14 ENCOUNTER — Ambulatory Visit (INDEPENDENT_AMBULATORY_CARE_PROVIDER_SITE_OTHER): Payer: BC Managed Care – PPO

## 2022-03-14 DIAGNOSIS — G8929 Other chronic pain: Secondary | ICD-10-CM

## 2022-03-14 DIAGNOSIS — M545 Low back pain, unspecified: Secondary | ICD-10-CM | POA: Diagnosis not present

## 2022-03-15 ENCOUNTER — Encounter: Payer: Self-pay | Admitting: Surgical

## 2022-03-15 NOTE — Progress Notes (Signed)
Office Visit Note   Patient: Sarah Mclean           Date of Birth: 1961-04-28           MRN: [id number] Visit Date: 03/14/2022 Requested by: Glenis Smoker, MD [place],  [place] [address] PCP: Glenis Smoker, MD  Subjective: Chief Complaint  Patient presents with   Lower Back - Pain    HPI: Sarah Mclean is a 61 y.o. female who presents to the office reporting back pain.  Patient complains of lower thoracic/upper lumbar back pain primarily.  She has history of T9 compression fracture at the end of 2022.  That pain resolved but recently over the last several months she has noticed onset of similar quality of pain but not quite as severe.  She does not have any history of a specific injury or event leading to onset of pain.  States that most of her pain is in the axial aspect of the lower thoracic spine.  Bothers her with laying on her back.  She does yoga and is very active, walking a lot and doing some strength training as well.  She denies any radiation of pain around into the chest wall or abdomen or down either leg.  No incontinence.  No numbness or tingling.  Pain is intermittent and is based on how active she is.  She has tried exercise program just like she did after her compression fracture following physical therapy guided protocol over the last several months without any relief..                ROS: All systems reviewed are negative as they relate to the chief complaint within the history of present illness.  Patient denies fevers or chills.  Assessment & Plan: Visit Diagnoses:  1. Chronic midline low back pain without sciatica     Plan: Patient is a 61 year old female who presents for evaluation of mid to low back pain.  Has history of T9 compression fracture that healed after several months and she has had several months of symptoms that feels similar to her prior fracture but not quite as severe.  Mostly associated with activity level.  No  radicular pain or weakness.  Radiographs of the lumbar spine taken today show no significant evidence of new fracture in the area where her pain is around T11.  Plan is MRI of the thoracic spine for further evaluation of thoracic disc protrusion versus occult compression fracture.  Also her home exercise program has not been very successful for her over the last several months she would like to try formal physical therapy in the meantime while the MRI is being ordered so therapy prescription for [facility] physical therapy was provided.  Follow-up after MRI to review results.  Follow-Up Instructions: No follow-ups on file.   Orders:  Orders Placed This Encounter  Procedures   XR Lumbar Spine 2-3 Views   MR Thoracic Spine w/o contrast   No orders of the defined types were placed in this encounter.     Procedures: No procedures performed   Clinical Data: No additional findings.  Objective: Vital Signs: There were no vitals taken for this visit.  Physical Exam:  Constitutional: Patient appears well-developed HEENT:  Head: Normocephalic Eyes:EOM are normal Neck: Normal range of motion Cardiovascular: Normal rate Pulmonary/chest: Effort normal Neurologic: Patient is alert Skin: Skin is warm Psychiatric: Patient has normal mood and affect  Ortho Exam: Ortho exam demonstrates tenderness over  the thoracic spine axially and lumbar spine axially with maximal tenderness around the level of T11 roughly.  There is no ecchymosis or swelling noted.  She has increased pain with flexion and extension of the spine.  Excellent strength of hip flexion, quadricep, hamstring, dorsiflexion, plantarflexion rated 5/5.  Negative straight leg raise.  No evidence of clonus.  No tenderness over the SI joints bilaterally.  Specialty Comments:  No specialty comments available.  Imaging: No results found.   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[facility])    Normal by chest CTA 03-2021   GERD (gastroesophageal reflux disease)    Hypercholesterolemia    Menopausal state    age 88 onset   Osteopenia    Osteoporosis    Parasomnia    Prediabetes    Rosacea    Vertigo    Vitamin D deficiency     Family History  Problem Relation Age of Onset   Cancer Mother        sarcoma   Colon cancer Sister    Cancer Sister        colon   Breast cancer Sister     Past Surgical History:  Procedure Laterality Date   CESAREAN SECTION     DILATION AND CURETTAGE OF UTERUS  2004   suction curettage   HEMORRHOID SURGERY     KNEE ARTHROSCOPY     RHINOPLASTY     TONSILLECTOMY     Social History   Occupational History    Employer: UNEMPLOYED  Tobacco Use   Smoking status: Never   Smokeless tobacco: Never  Vaping Use   Vaping Use: Never used  Substance and Sexual Activity   Alcohol use: Not Currently   Drug use: No   Sexual activity: Yes    Partners: Male    Birth control/protection: Post-menopausal    Comment: intercourse age 21, less than 5 sexual partners, des neg

## 2022-03-27 DIAGNOSIS — Z0000 Encounter for general adult medical examination without abnormal findings: Secondary | ICD-10-CM | POA: Diagnosis not present

## 2022-04-02 ENCOUNTER — Ambulatory Visit
Admission: RE | Admit: 2022-04-02 | Discharge: 2022-04-02 | Disposition: A | Discharge status: Home / Self Care | Payer: BC Managed Care – PPO | Source: Ambulatory Visit | Attending: Surgical | Admitting: Surgical

## 2022-04-02 DIAGNOSIS — G8929 Other chronic pain: Secondary | ICD-10-CM

## 2022-04-02 DIAGNOSIS — M549 Dorsalgia, unspecified: Secondary | ICD-10-CM | POA: Diagnosis not present

## 2022-04-17 ENCOUNTER — Telehealth: Payer: Self-pay | Admitting: Orthopedic Surgery

## 2022-04-17 NOTE — Telephone Encounter (Signed)
Spoke with patient she advised she is going out of town for [employer] and wanted to know if her MRI results can be call into her?  The number to contact patient is [phone]

## 2022-04-23 NOTE — Telephone Encounter (Signed)
noted 

## 2022-04-23 NOTE — Telephone Encounter (Signed)
I called and advised her of her results.  Her back pain is overall doing pretty well.  Does not have back pain every day.  She will follow-up if she has continued or increased pain and next step would be MRI of the lumbar spine.

## 2022-05-02 DIAGNOSIS — M810 Age-related osteoporosis without current pathological fracture: Secondary | ICD-10-CM | POA: Diagnosis not present

## 2022-05-02 DIAGNOSIS — Z79890 Hormone replacement therapy (postmenopausal): Secondary | ICD-10-CM | POA: Diagnosis not present

## 2022-05-02 DIAGNOSIS — N951 Menopausal and female climacteric states: Secondary | ICD-10-CM | POA: Diagnosis not present

## 2022-05-14 DIAGNOSIS — F4320 Adjustment disorder, unspecified: Secondary | ICD-10-CM | POA: Diagnosis not present

## 2022-05-28 DIAGNOSIS — M79674 Pain in right toe(s): Secondary | ICD-10-CM | POA: Diagnosis not present

## 2022-06-04 DIAGNOSIS — M79674 Pain in right toe(s): Secondary | ICD-10-CM | POA: Diagnosis not present

## 2022-06-18 DIAGNOSIS — F4320 Adjustment disorder, unspecified: Secondary | ICD-10-CM | POA: Diagnosis not present

## 2022-06-25 DIAGNOSIS — M79674 Pain in right toe(s): Secondary | ICD-10-CM | POA: Diagnosis not present

## 2022-06-27 DIAGNOSIS — E559 Vitamin D deficiency, unspecified: Secondary | ICD-10-CM | POA: Diagnosis not present

## 2022-06-27 DIAGNOSIS — M810 Age-related osteoporosis without current pathological fracture: Secondary | ICD-10-CM | POA: Diagnosis not present

## 2022-06-27 DIAGNOSIS — Z13220 Encounter for screening for lipoid disorders: Secondary | ICD-10-CM | POA: Diagnosis not present

## 2022-06-27 DIAGNOSIS — E039 Hypothyroidism, unspecified: Secondary | ICD-10-CM | POA: Diagnosis not present

## 2022-06-27 DIAGNOSIS — E7211 Homocystinuria: Secondary | ICD-10-CM | POA: Diagnosis not present

## 2022-06-27 DIAGNOSIS — D696 Thrombocytopenia, unspecified: Secondary | ICD-10-CM | POA: Diagnosis not present

## 2022-06-27 DIAGNOSIS — M3581 Multisystem inflammatory syndrome: Secondary | ICD-10-CM | POA: Diagnosis not present

## 2022-06-27 DIAGNOSIS — R739 Hyperglycemia, unspecified: Secondary | ICD-10-CM | POA: Diagnosis not present

## 2022-07-16 ENCOUNTER — Ambulatory Visit (INDEPENDENT_AMBULATORY_CARE_PROVIDER_SITE_OTHER): Payer: BC Managed Care – PPO | Admitting: Obstetrics & Gynecology

## 2022-07-16 ENCOUNTER — Encounter: Payer: Self-pay | Admitting: Obstetrics & Gynecology

## 2022-07-16 VITALS — BP 100/64 | HR 87 | Ht 63.5 in | Wt 119.0 lb

## 2022-07-16 DIAGNOSIS — M810 Age-related osteoporosis without current pathological fracture: Secondary | ICD-10-CM | POA: Diagnosis not present

## 2022-07-16 DIAGNOSIS — Z01419 Encounter for gynecological examination (general) (routine) without abnormal findings: Secondary | ICD-10-CM

## 2022-07-16 DIAGNOSIS — Z780 Asymptomatic menopausal state: Secondary | ICD-10-CM | POA: Diagnosis not present

## 2022-07-16 DIAGNOSIS — F4320 Adjustment disorder, unspecified: Secondary | ICD-10-CM | POA: Diagnosis not present

## 2022-07-16 DIAGNOSIS — M7989 Other specified soft tissue disorders: Secondary | ICD-10-CM | POA: Diagnosis not present

## 2022-07-16 NOTE — Progress Notes (Signed)
Sarah Mclean March 22, 1961 [id number]   History:    61 y.o. [id number] (1 adopted)  Married.     RP:  Established patient presenting for annual gyn exam    HPI: Postmenopause in her 73's, currently on Estradiol and Testo pellets and Prometrium 100 mg HS through [facility].  Informed that the dosage of Estradiol in pellets is higher than the safe dosage studied.  Patient understands and wants to continue on that regimen non-the-less.  No PMB. No pelvic pain.  No pain with IC.  Pap Neg 03/2021. Will repeat Pap at 3 years.  Bone Density 04/2021 showed Osteoporosis. Lowest T-Score -2.8 at the Spine.  Had a vertebral fracture in 2023 on the trampoline.  Declines bone medication.  On HRT.  Weight bearing physical activity. Taking a Bone Supplement with Vit D.  Urine/BMs wnl. Breasts normal. Mammo at [facility] per patient in 12/2021. BMI 20.75. Sister with Breast Ca.  Sister had genetic testing, BrCa 1-2 negative.  Health labs with Fam MD.  COLONOSCOPY: 2018.   Past medical history,surgical history, family history and social history were all reviewed and documented in the EPIC chart.  Gynecologic History No LMP recorded. Patient is postmenopausal.  Obstetric History OB History  Gravida Para Term Preterm AB Living  6 2 2   4 2   SAB IAB Ectopic Multiple Live Births  4       2    # Outcome Date GA Lbr Len/2nd Weight Sex Delivery Anes PTL Lv  6 SAB           5 SAB           4 SAB           3 SAB           2 Term           1 Term              ROS: A ROS was performed and pertinent positives and negatives are included in the history. GENERAL: No fevers or chills. HEENT: No change in vision, no earache, sore throat or sinus congestion. NECK: No pain or stiffness. CARDIOVASCULAR: No chest pain or pressure. No palpitations. PULMONARY: No shortness of breath, cough or wheeze. GASTROINTESTINAL: No abdominal pain, nausea, vomiting or diarrhea, melena or bright red blood per rectum.  GENITOURINARY: No urinary frequency, urgency, hesitancy or dysuria. MUSCULOSKELETAL: No joint or muscle pain, no back pain, no recent trauma. DERMATOLOGIC: No rash, no itching, no lesions. ENDOCRINE: No polyuria, polydipsia, no heat or cold intolerance. No recent change in weight. HEMATOLOGICAL: No anemia or easy bruising or bleeding. NEUROLOGIC: No headache, seizures, numbness, tingling or weakness. PSYCHIATRIC: No depression, no loss of interest in normal activity or change in sleep pattern.     Exam:   BP 100/64   Pulse 87   Ht 5' 3.5" (1.613 m)   Wt 119 lb (54 kg)   SpO2 98%   BMI 20.75 kg/m   Body mass index is 20.75 kg/m.  General appearance : Well developed well nourished female. No acute distress HEENT: Eyes: no retinal hemorrhage or exudates,  Neck supple, trachea midline, no carotid bruits, no thyroidmegaly Lungs: Clear to auscultation, no rhonchi or wheezes, or rib retractions  Heart: Regular rate and rhythm, no murmurs or gallops Breast:Examined in sitting and supine position were symmetrical in appearance, no palpable masses or tenderness,  no skin retraction, no nipple inversion, no nipple discharge, no skin discoloration, no axillary  or supraclavicular lymphadenopathy Abdomen: no palpable masses or tenderness, no rebound or guarding Extremities: no edema or skin discoloration or tenderness  Pelvic: Vulva: Normal             Vagina: No gross lesions or discharge  Cervix: No gross lesions or discharge  Uterus  AV, normal size, shape and consistency, non-tender and mobile  Adnexa  Without masses or tenderness  Anus: Normal   Assessment/Plan:  62 y.o. female for annual exam   1. Well female exam with routine gynecological exam Postmenopause in her 18's, currently on Estradiol and Testo pellets and Prometrium 100 mg HS through [facility].  Informed that the dosage of Estradiol in pellets is higher than the safe dosage studied.  Patient understands and wants to  continue on that regimen non-the-less.  No PMB. No pelvic pain.  No pain with IC.  Pap Neg 03/2021. Will repeat Pap at 3 years.  Bone Density 04/2021 showed Osteoporosis. Lowest T-Score -2.8 at the Spine.  Had a vertebral fracture in 2023 on the trampoline.  Declines bone medication.  On HRT.  Weight bearing physical activity. Taking a Bone Supplement with Vit D.  Urine/BMs wnl. Breasts normal. Mammo at [facility] per patient in 12/2021. BMI 20.75. Sister with Breast Ca.  Sister had genetic testing, BrCa 1-2 negative.  Health labs with Fam MD.  COLONOSCOPY: 2018.  2. Postmenopause On HRT with Estradiol pellet/Testo pellet and Prometrium 100 mg HS.   Informed that the dosage of Estradiol in pellets is higher than the safe dosage studied.  Patient understands and wants to continue on that regimen non-the-less.  No PMB. No pelvic pain.  No pain with IC.   3. Age-related osteoporosis without current pathological fracture Bone Density 04/2021 showed Osteoporosis. Lowest T-Score -2.8 at the Spine.  Had a vertebral fracture in 2023 on the trampoline.  Declines bone medication.  On HRT.  Weight bearing physical activity. Taking a Bone Supplement with Vit D.   Other orders - valACYclovir (VALTREX) 500 MG tablet; 2 tablets at onset then 2 tablets 12 hours later Orally as needed for 3 days - pyridoxine (B-6) 250 MG tablet; 1 tablet Orally twice a day - progesterone (PROMETRIUM) 200 MG capsule; Progesterone SR - liothyronine (CYTOMEL) 5 MCG tablet; 1 tablet on an empty stomach Orally Once a day for 30 days - UNABLE TO FIND; Med Name: testosterone 60mg  - UNABLE TO FIND; Med Name: estradiol pellet 12.5mg  - UNABLE TO FIND; Med Name: Dimpro 150mg  - UNABLE TO FIND; daily. Med Name: ADK (vitamin A 1563mcg, K2 541mcg, Vitamin D3 5,000iu) - EVENING PRIMROSE OIL PO; Take by mouth. 3 capsules ( 1 in the am & 2 in the pm) - UNABLE TO FIND; Med Name: libido as needed - Multiple Vitamins-Minerals (ZINC PO); Take 15 mg by  mouth daily. - UNABLE TO FIND; Med Name: energy multiplex (3 capsules) 2 in the am & 1 in the pm - UNABLE TO FIND; 350 mg. Med Name: magnesium 7 (takes 4 capsules) 2 in the am & 2 in the pm   [name] MD, 3:41 PM

## 2022-08-06 DIAGNOSIS — K649 Unspecified hemorrhoids: Secondary | ICD-10-CM | POA: Diagnosis not present

## 2022-08-06 DIAGNOSIS — Z800 Family history of malignant neoplasm of digestive organs: Secondary | ICD-10-CM | POA: Diagnosis not present

## 2022-08-06 DIAGNOSIS — Z1211 Encounter for screening for malignant neoplasm of colon: Secondary | ICD-10-CM | POA: Diagnosis not present

## 2022-08-08 DIAGNOSIS — F4320 Adjustment disorder, unspecified: Secondary | ICD-10-CM | POA: Diagnosis not present

## 2022-08-14 DIAGNOSIS — L821 Other seborrheic keratosis: Secondary | ICD-10-CM | POA: Diagnosis not present

## 2022-08-14 DIAGNOSIS — L570 Actinic keratosis: Secondary | ICD-10-CM | POA: Diagnosis not present

## 2022-08-22 DIAGNOSIS — N951 Menopausal and female climacteric states: Secondary | ICD-10-CM | POA: Diagnosis not present

## 2022-08-22 DIAGNOSIS — Z79890 Hormone replacement therapy (postmenopausal): Secondary | ICD-10-CM | POA: Diagnosis not present

## 2022-08-22 DIAGNOSIS — E039 Hypothyroidism, unspecified: Secondary | ICD-10-CM | POA: Diagnosis not present

## 2022-08-25 DIAGNOSIS — M25551 Pain in right hip: Secondary | ICD-10-CM | POA: Diagnosis not present

## 2022-09-10 DIAGNOSIS — F4320 Adjustment disorder, unspecified: Secondary | ICD-10-CM | POA: Diagnosis not present

## 2022-09-30 DIAGNOSIS — L570 Actinic keratosis: Secondary | ICD-10-CM | POA: Diagnosis not present

## 2022-11-27 DIAGNOSIS — F4320 Adjustment disorder, unspecified: Secondary | ICD-10-CM | POA: Diagnosis not present

## 2022-12-16 DIAGNOSIS — L814 Other melanin hyperpigmentation: Secondary | ICD-10-CM | POA: Diagnosis not present

## 2022-12-16 DIAGNOSIS — D225 Melanocytic nevi of trunk: Secondary | ICD-10-CM | POA: Diagnosis not present

## 2022-12-16 DIAGNOSIS — L570 Actinic keratosis: Secondary | ICD-10-CM | POA: Diagnosis not present

## 2022-12-16 DIAGNOSIS — Z86018 Personal history of other benign neoplasm: Secondary | ICD-10-CM | POA: Diagnosis not present

## 2022-12-16 DIAGNOSIS — L821 Other seborrheic keratosis: Secondary | ICD-10-CM | POA: Diagnosis not present

## 2023-01-07 DIAGNOSIS — F4320 Adjustment disorder, unspecified: Secondary | ICD-10-CM | POA: Diagnosis not present

## 2023-01-27 IMAGING — CT CTA CHEST W/ AND/OR W/O CM W/ OR W/O DISSECTION AND GATING
1 of 9 series · 3 of 16 positions shown, 4 images · IV contrast (APPLIED)
Comparison: Cardiac CT September 2020

CLINICAL DATA: Thoracic aortic aneurysm (APARECIDA), follow up

EXAM:
CT ANGIOGRAPHY CHEST WITH CONTRAST
TECHNIQUE: Multidetector CT imaging of the chest was performed using the
standard protocol during bolus administration of intravenous
contrast. Multiplanar CT image reconstructions and MIPs were
obtained to evaluate the vascular anatomy.
CONTRAST:  80mL OMNIPAQUE IOHEXOL 350 MG/ML SOLN

[Series 10: arterial thins · axial · arterial · 0.62mm/px · z∈[-216,-67]mm · 3 of 498 slices shown, 4 images]
[im 125/498  soft-tissue]
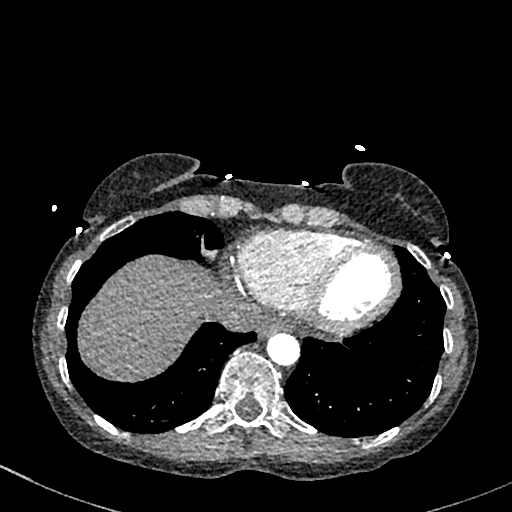
[im 125/498  bone]
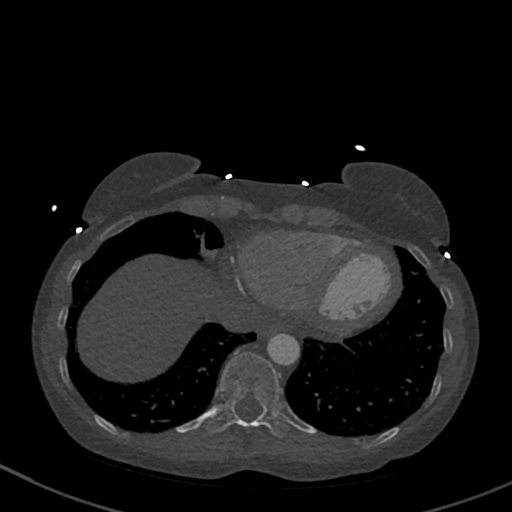
[im 249/498  soft-tissue]
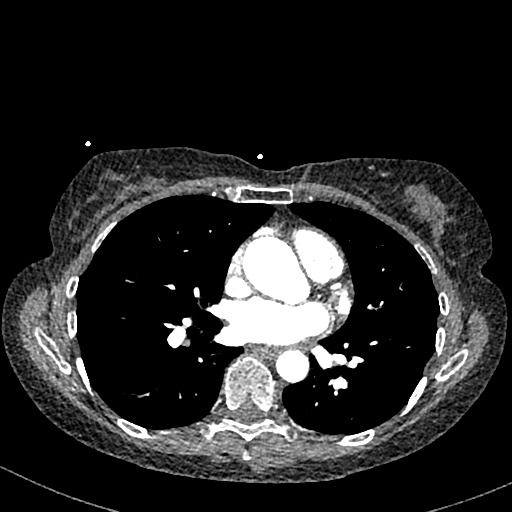
[im 373/498  soft-tissue]
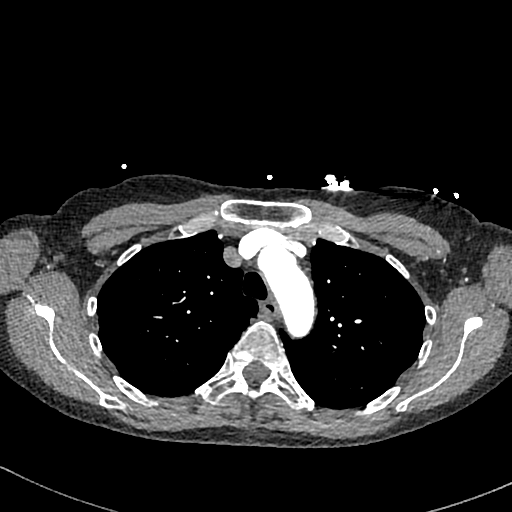

[3 of 16 positions shown; findings below may reference images not displayed]

FINDINGS: Cardiovascular: Preferential opacification of the thoracic aorta. No
evidence of thoracic aortic aneurysm or dissection. The ascending
thoracic aorta measures up to a maximum diameter of 3.3 cm. The
aortic arch measures up to 2.7 cm. Descending thoracic aorta is
normal in caliber. The pulmonary arteries are normal in caliber
without central filling defects. Normal heart size. No pericardial
effusion.

Mediastinum/Nodes: No enlarged mediastinal, hilar, or axillary lymph
nodes. Incidental note is made of a subcentimeter right thyroid lobe
nodule, no dedicated follow-up imaging is recommended (Reference: [HOSPITAL]. [DATE]): 143-50).

Lungs/Pleura: No pleural effusion. No pneumothorax. No mass or focal
consolidation. No suspicious pulmonary nodules.

Musculoskeletal: No aggressive osseous lesions.

Upper abdomen: The visualized upper abdomen is unremarkable.

Review of the MIP images confirms the above findings.
IMPRESSION: 1. No findings of thoracic aortic aneurysm. The ascending thoracic
aorta, aortic arch and descending thoracic aorta all measure within
normal limits.
2. No acute findings in the chest.

## 2023-02-13 DIAGNOSIS — Z1231 Encounter for screening mammogram for malignant neoplasm of breast: Secondary | ICD-10-CM | POA: Diagnosis not present

## 2023-02-25 DIAGNOSIS — N6320 Unspecified lump in the left breast, unspecified quadrant: Secondary | ICD-10-CM | POA: Diagnosis not present

## 2023-02-25 DIAGNOSIS — N6324 Unspecified lump in the left breast, lower inner quadrant: Secondary | ICD-10-CM | POA: Diagnosis not present

## 2023-02-25 DIAGNOSIS — N6002 Solitary cyst of left breast: Secondary | ICD-10-CM | POA: Diagnosis not present

## 2023-02-25 DIAGNOSIS — N6489 Other specified disorders of breast: Secondary | ICD-10-CM | POA: Diagnosis not present

## 2023-02-25 DIAGNOSIS — R922 Inconclusive mammogram: Secondary | ICD-10-CM | POA: Diagnosis not present

## 2023-02-27 DIAGNOSIS — Z79890 Hormone replacement therapy (postmenopausal): Secondary | ICD-10-CM | POA: Diagnosis not present

## 2023-02-27 DIAGNOSIS — N951 Menopausal and female climacteric states: Secondary | ICD-10-CM | POA: Diagnosis not present

## 2023-03-02 DIAGNOSIS — F4320 Adjustment disorder, unspecified: Secondary | ICD-10-CM | POA: Diagnosis not present

## 2023-03-09 ENCOUNTER — Other Ambulatory Visit: Payer: Self-pay | Admitting: Radiology

## 2023-03-09 DIAGNOSIS — N6012 Diffuse cystic mastopathy of left breast: Secondary | ICD-10-CM | POA: Diagnosis not present

## 2023-03-09 DIAGNOSIS — N6324 Unspecified lump in the left breast, lower inner quadrant: Secondary | ICD-10-CM | POA: Diagnosis not present

## 2023-03-10 LAB — SURGICAL PATHOLOGY

## 2023-03-24 DIAGNOSIS — Z803 Family history of malignant neoplasm of breast: Secondary | ICD-10-CM | POA: Diagnosis not present

## 2023-03-24 DIAGNOSIS — Z1239 Encounter for other screening for malignant neoplasm of breast: Secondary | ICD-10-CM | POA: Diagnosis not present

## 2023-03-24 DIAGNOSIS — Z139 Encounter for screening, unspecified: Secondary | ICD-10-CM | POA: Diagnosis not present

## 2023-03-24 DIAGNOSIS — R928 Other abnormal and inconclusive findings on diagnostic imaging of breast: Secondary | ICD-10-CM | POA: Diagnosis not present

## 2023-03-24 DIAGNOSIS — Z129 Encounter for screening for malignant neoplasm, site unspecified: Secondary | ICD-10-CM | POA: Diagnosis not present

## 2023-04-01 DIAGNOSIS — Z0000 Encounter for general adult medical examination without abnormal findings: Secondary | ICD-10-CM | POA: Diagnosis not present

## 2023-04-03 DIAGNOSIS — E7800 Pure hypercholesterolemia, unspecified: Secondary | ICD-10-CM | POA: Diagnosis not present

## 2023-05-01 DIAGNOSIS — N951 Menopausal and female climacteric states: Secondary | ICD-10-CM | POA: Diagnosis not present

## 2023-05-01 DIAGNOSIS — Z79890 Hormone replacement therapy (postmenopausal): Secondary | ICD-10-CM | POA: Diagnosis not present

## 2023-06-03 DIAGNOSIS — L820 Inflamed seborrheic keratosis: Secondary | ICD-10-CM | POA: Diagnosis not present

## 2023-07-10 DIAGNOSIS — N951 Menopausal and female climacteric states: Secondary | ICD-10-CM | POA: Diagnosis not present

## 2023-07-10 DIAGNOSIS — Z79890 Hormone replacement therapy (postmenopausal): Secondary | ICD-10-CM | POA: Diagnosis not present

## 2023-07-20 ENCOUNTER — Other Ambulatory Visit (HOSPITAL_COMMUNITY)
Admission: RE | Admit: 2023-07-20 | Discharge: 2023-07-20 | Disposition: A | Discharge status: Home / Self Care | Source: Ambulatory Visit | Attending: Obstetrics and Gynecology | Admitting: Obstetrics and Gynecology

## 2023-07-20 ENCOUNTER — Ambulatory Visit (INDEPENDENT_AMBULATORY_CARE_PROVIDER_SITE_OTHER): Payer: BC Managed Care – PPO | Admitting: Obstetrics and Gynecology

## 2023-07-20 ENCOUNTER — Encounter: Payer: Self-pay | Admitting: Obstetrics and Gynecology

## 2023-07-20 VITALS — BP 120/72 | HR 84 | Ht 63.5 in | Wt 118.0 lb

## 2023-07-20 DIAGNOSIS — M810 Age-related osteoporosis without current pathological fracture: Secondary | ICD-10-CM

## 2023-07-20 DIAGNOSIS — E2839 Other primary ovarian failure: Secondary | ICD-10-CM | POA: Diagnosis not present

## 2023-07-20 DIAGNOSIS — Z01419 Encounter for gynecological examination (general) (routine) without abnormal findings: Secondary | ICD-10-CM | POA: Diagnosis not present

## 2023-07-20 DIAGNOSIS — Z1331 Encounter for screening for depression: Secondary | ICD-10-CM | POA: Diagnosis not present

## 2023-07-20 DIAGNOSIS — M8000XD Age-related osteoporosis with current pathological fracture, unspecified site, subsequent encounter for fracture with routine healing: Secondary | ICD-10-CM

## 2023-07-20 NOTE — Progress Notes (Signed)
 63 y.o. y.o. female here for annual exam. No LMP recorded. Patient is postmenopausal.    [id number] (1 adopted)  Married.     RP:  Established patient presenting for annual gyn exam    HPI: Postmenopause in her 3's, currently on Estradiol and Testo pellets and Prometrium 100 mg HS through [facility].  Informed that the dosage of Estradiol in pellets is higher than the safe dosage studied.  Patient understands and wants to continue on that regimen non-the-less.  No PMB. No pelvic pain.  No pain with IC.  Pap Neg 03/2021. 07/20/23 repeat pap collected  .  Bone Density 04/2021 showed Osteoporosis. Lowest T-Score -2.8 at the Spine.  Had a vertebral fracture in 2023 on the trampoline.  Declines bone medication.  On HRT.  Weight bearing physical activity. Taking a Bone Supplement with Vit D.  Urine/BMs wnl. Breasts normal. Mammo at [facility] per patient in 12/2021. BMI 20.75. Sister with Breast Ca.  Sister had genetic testing, BrCa 1-2 negative.  Health labs with Fam MD.  COLONOSCOPY: 2024 and told her to repeat in 7 years. Has been on pellets and is hoping this helped her bone score. Counseled on need for treatment and PA placed for evenity. Has had a compression vertebral fracture with jumping on trampoline in the past. Referral for repeat dxa scan placed asap.  She has reflux and does not want to take fosamax, reclast or PTH injection.   Body mass index is 20.57 kg/m.     07/20/2023    2:53 PM  Depression screen PHQ 2/9  Decreased Interest 0  Down, Depressed, Hopeless 0  PHQ - 2 Score 0    Blood pressure 120/72, pulse 84, height 5' 3.5" (1.613 m), weight 118 lb (53.5 kg), SpO2 98%.     Component Value Date/Time   DIAGPAP  03/28/2021 1524    - Negative for intraepithelial lesion or malignancy (NILM)   ADEQPAP  03/28/2021 1524    Satisfactory for evaluation; transformation zone component PRESENT.    GYN HISTORY:    Component Value Date/Time   DIAGPAP  03/28/2021 1524     - Negative for intraepithelial lesion or malignancy (NILM)   ADEQPAP  03/28/2021 1524    Satisfactory for evaluation; transformation zone component PRESENT.    OB History  Gravida Para Term Preterm AB Living  6 2 2  4 2   SAB IAB Ectopic Multiple Live Births  4    2    # Outcome Date GA Lbr Len/2nd Weight Sex Type Anes PTL Lv  6 SAB           5 SAB           4 SAB           3 SAB           2 Term           1 Term             Past Medical History:  Diagnosis Date  . Cancer ([facility]CC)    OF GI TRACT  . Dilation of aorta ([facility]CC)    Normal by chest CTA 03-2021  . GERD (gastroesophageal reflux disease)   . Hypercholesterolemia   . Menopausal state    age 77 onset  . Osteopenia   . Osteoporosis   . Parasomnia   . Prediabetes   . Rosacea   . Vertigo   . Vitamin D deficiency     Past Surgical  History:  Procedure Laterality Date  . CESAREAN SECTION    . DILATION AND CURETTAGE OF UTERUS  2004   suction curettage  . HEMORRHOID SURGERY    . KNEE ARTHROSCOPY    . RHINOPLASTY    . TONSILLECTOMY      Current Outpatient Medications on File Prior to Visit  Medication Sig Dispense Refill  . EVENING PRIMROSE OIL PO Take by mouth. 3 capsules ( 1 in the am & 2 in the pm)    . Multiple Vitamins-Minerals (ZINC PO) Take 15 mg by mouth daily.    . NP THYROID 30 MG tablet Take 30 mg by mouth every morning.    . Omega-3 Fatty Acids (OMEGA 3 PO) Take by mouth.    . progesterone (PROMETRIUM) 200 MG capsule Progesterone SR    . pyridoxine (B-6) 250 MG tablet 1 tablet Orally twice a day    . UNABLE TO FIND Med Name: testosterone 60mg     . UNABLE TO FIND Med Name: estradiol pellet 12.5mg     . UNABLE TO FIND Med Name: Dimpro 150mg     . UNABLE TO FIND daily. Med Name: ADK (vitamin A , K2 , Vitamin D3 5,000iu)    . UNABLE TO FIND Med Name: libido as needed (pellets)    . UNABLE TO FIND Med Name: energy multiplex (3 capsules) 2 in the am & 1 in the pm    . UNABLE TO FIND 350 mg. Med  Name: magnesium 7 (takes 4 capsules) 2 in the am & 2 in the pm    . valACYclovir (VALTREX) 500 MG tablet 2 tablets at onset then 2 tablets 12 hours later Orally as needed for 3 days     No current facility-administered medications on file prior to visit.    Social History   Socioeconomic History  . Marital status: Married    Spouse name: Renae Fickle  . Number of children: 3  . Years of education: 12+4   . Highest education level: Not on file  Occupational History    Employer: UNEMPLOYED  Tobacco Use  . Smoking status: Never  . Smokeless tobacco: Never  Vaping Use  . Vaping status: Never Used  Substance and Sexual Activity  . Alcohol use: Yes    Comment: occ  . Drug use: No  . Sexual activity: Yes    Partners: Male    Birth control/protection: Post-menopausal    Comment: intercourse age 86, less than 5 sexual partners, des neg  Other Topics Concern  . Not on file  Social History Narrative   Patient is married Renae Fickle) and lives at home with her husband and three children.   Patient has two biological children and one adopted.   Patient drinks one soda on most days.   Social Drivers of Corporate investment banker Strain: Not on file  Food Insecurity: Not on file  Transportation Needs: Not on file  Physical Activity: Not on file  Stress: Not on file  Social Connections: Unknown (09/10/2021)   Received from [facility], [facility]   Social Network   . Social Network: Not on file  Intimate Partner Violence: Unknown (08/02/2021)   Received from [facility], [facility]   HITS   . Physically Hurt: Not on file   . Insult or Talk Down To: Not on file   . Threaten Physical Harm: Not on file   . Scream or Curse: Not on file    Family History  Problem Relation Age of Onset  .  Cancer Mother        sarcoma  . Colon cancer Sister   . Cancer Sister        colon  . Breast cancer Sister      Allergies  Allergen Reactions  . Hydrocodone-Acetaminophen Other (See  Comments)    Syncope  . Latex   . Vicodin [Hydrocodone-Acetaminophen]   . Erythromycin Palpitations  . Penicillins Rash      Patient's last menstrual period was No LMP recorded. Patient is postmenopausal..             Review of Systems Alls systems reviewed and are negative.     Physical Exam Constitutional:      Appearance: Normal appearance.  Genitourinary:     Vulva and urethral meatus normal.     No lesions in the vagina.     Right Labia: No rash, lesions or skin changes.    Left Labia: No lesions, skin changes or rash.    No vaginal discharge or tenderness.     No vaginal prolapse present.    No vaginal atrophy present.     Right Adnexa: not tender, not palpable and no mass present.    Left Adnexa: not tender, not palpable and no mass present.    No cervical motion tenderness or discharge.     Uterus is not enlarged, tender or irregular.  Breasts:    Right: Normal.     Left: Normal.  HENT:     Head: Normocephalic.  Neck:     Thyroid: No thyroid mass, thyromegaly or thyroid tenderness.  Cardiovascular:     Rate and Rhythm: Normal rate and regular rhythm.     Heart sounds: Normal heart sounds, S1 normal and S2 normal.  Pulmonary:     Effort: Pulmonary effort is normal.     Breath sounds: Normal breath sounds and air entry.  Abdominal:     General: There is no distension.     Palpations: Abdomen is soft. There is no mass.     Tenderness: There is no abdominal tenderness. There is no guarding or rebound.  Musculoskeletal:        General: Normal range of motion.     Cervical back: Full passive range of motion without pain, normal range of motion and neck supple. No tenderness.     Right lower leg: No edema.     Left lower leg: No edema.  Neurological:     Mental Status: She is alert.  Skin:    General: Skin is warm.  Psychiatric:        Mood and Affect: Mood normal.        Behavior: Behavior normal.        Thought Content: Thought content normal.   Vitals and nursing note reviewed. Exam conducted with a chaperone present.      A:         Well Woman GYN exam                             P:        Pap smear collected today Encouraged annual mammogram screening Colon cancer screening up-to-date DXA ordered today  Encouraged vit D and calcium 1200mg  daily(can subtract daily food source) Counseled on evenity infusions for a year followed by prolia injections.  Discussed she needs a holiday from fosomax with max use 3-5 years.  Can return after a holiday.  Discussed advantage of evenity to  build bones and improve bone mineral density and lower risk of fractures by also decreasing bone resorption at the same time.  Discussed this is monthly for a year and prolia is Q 6 months injection  Prolia should be continued and risk of stopping increased risk of vetebral fractures.  If insurance does not cover evenity, we can start prolia, but would be better to start with evenity.   Discussed she will need monitoring for her calcium and vit D while taking the medication Q 6 months, as a side effect is hypocalcemia.  Most common side effect with evenity is headaches and joint pain.  She cannot take if she has chronic kidney disease or if a recent MI. Counseled on weight bearing exercises with walking, weight bearing, swimming, which she is doing   Labs and immunizations to do with PMD Discussed breast self exams Encouraged healthy lifestyle practices Encouraged Vit D and Calcium   No follow-ups on file.  Earley Favor

## 2023-07-21 ENCOUNTER — Encounter: Payer: Self-pay | Admitting: Obstetrics and Gynecology

## 2023-07-21 LAB — CYTOLOGY - PAP: Diagnosis: NEGATIVE

## 2023-07-23 ENCOUNTER — Telehealth: Payer: Self-pay | Admitting: *Deleted

## 2023-07-23 NOTE — Telephone Encounter (Signed)
 Insurance information submitted to [employer] portal. Will await summary of benefits for Evenity.

## 2023-07-23 NOTE — Telephone Encounter (Signed)
-----   Message from Earley Favor sent at 07/20/2023  3:17 PM EDT -----  Needs pa for evenity  2023 osteoporosis (-2.8) no medication tried HRT for 2 years and would like to see if there is improvement. history of spinal compression fracture Ordered dxa stat.   Counseled on evenity and prolia Has bcbs  Has reflux and does not want fosamax Thank you Irving Burton Dr. Karma Greaser

## 2023-07-24 DIAGNOSIS — F4320 Adjustment disorder, unspecified: Secondary | ICD-10-CM | POA: Diagnosis not present

## 2023-07-27 ENCOUNTER — Other Ambulatory Visit (HOSPITAL_BASED_OUTPATIENT_CLINIC_OR_DEPARTMENT_OTHER)

## 2023-07-28 NOTE — Telephone Encounter (Signed)
 Benefits will be rerun with [employer] per Amy, Rep.

## 2023-07-30 ENCOUNTER — Ambulatory Visit (HOSPITAL_BASED_OUTPATIENT_CLINIC_OR_DEPARTMENT_OTHER)
Admission: RE | Admit: 2023-07-30 | Discharge: 2023-07-30 | Disposition: A | Discharge status: Home / Self Care | Source: Ambulatory Visit | Attending: Obstetrics and Gynecology | Admitting: Obstetrics and Gynecology

## 2023-07-30 ENCOUNTER — Encounter: Payer: Self-pay | Admitting: Obstetrics and Gynecology

## 2023-07-30 DIAGNOSIS — E2839 Other primary ovarian failure: Secondary | ICD-10-CM | POA: Insufficient documentation

## 2023-07-30 DIAGNOSIS — Z01419 Encounter for gynecological examination (general) (routine) without abnormal findings: Secondary | ICD-10-CM | POA: Insufficient documentation

## 2023-07-30 DIAGNOSIS — M8000XD Age-related osteoporosis with current pathological fracture, unspecified site, subsequent encounter for fracture with routine healing: Secondary | ICD-10-CM | POA: Insufficient documentation

## 2023-07-30 DIAGNOSIS — M8589 Other specified disorders of bone density and structure, multiple sites: Secondary | ICD-10-CM | POA: Diagnosis not present

## 2023-08-04 DIAGNOSIS — J069 Acute upper respiratory infection, unspecified: Secondary | ICD-10-CM | POA: Diagnosis not present

## 2023-08-04 NOTE — Telephone Encounter (Signed)
 Call to [place] [employer] [place], spoke with [place], Rep, and initiated PA for [employer]. Status is pending. Clinical records faxed to [phone]. Reference #: [id number]. Will await response.

## 2023-08-11 NOTE — Telephone Encounter (Signed)
 Appeal letter written and taken to Dr. Gioia Laity desk to review and sign upon return to the office. Will fax once signed.

## 2023-08-11 NOTE — Telephone Encounter (Signed)
 PA for Evenity denied due to not medically necessary. Will start appeal letter.

## 2023-08-18 NOTE — Telephone Encounter (Signed)
 Appeal letter signed by Dr. Tia Flowers and faxed along with clinical records to [facility]  at [phone]. Will await response.

## 2023-08-21 DIAGNOSIS — R053 Chronic cough: Secondary | ICD-10-CM | POA: Diagnosis not present

## 2023-09-02 DIAGNOSIS — L820 Inflamed seborrheic keratosis: Secondary | ICD-10-CM | POA: Diagnosis not present

## 2023-09-02 DIAGNOSIS — L570 Actinic keratosis: Secondary | ICD-10-CM | POA: Diagnosis not present

## 2023-09-04 DIAGNOSIS — Z79890 Hormone replacement therapy (postmenopausal): Secondary | ICD-10-CM | POA: Diagnosis not present

## 2023-09-04 DIAGNOSIS — N951 Menopausal and female climacteric states: Secondary | ICD-10-CM | POA: Diagnosis not present

## 2023-10-01 NOTE — Telephone Encounter (Signed)
 Spoke with Amy, [employer] Representative, who contacted [employer] [place] for authorization #. Evenity authorization #: [id number]. Effective dates 08/07/23 to 08/06/24. Reference #: [id number].

## 2023-10-01 NOTE — Telephone Encounter (Signed)
 Call to patient. Patient declines to schedule Evenity injections. States her Dexa improved on hormone replacement therapy, so "I am fine with what I'm doing." Declines prolia as well.RN advised would update Dr. Tia Flowers. Patient agreeable.   Routing to provider as Arlie Lain. Encounter closed.

## 2023-10-27 DIAGNOSIS — R350 Frequency of micturition: Secondary | ICD-10-CM | POA: Diagnosis not present

## 2023-11-03 ENCOUNTER — Ambulatory Visit (INDEPENDENT_AMBULATORY_CARE_PROVIDER_SITE_OTHER): Admitting: Radiology

## 2023-11-03 ENCOUNTER — Encounter: Payer: Self-pay | Admitting: Radiology

## 2023-11-03 VITALS — BP 116/68 | HR 75 | Wt 117.2 lb

## 2023-11-03 DIAGNOSIS — D280 Benign neoplasm of vulva: Secondary | ICD-10-CM

## 2023-11-03 NOTE — Progress Notes (Signed)
      Subjective: Sarah Mclean is a 63 y.o. female who complains of black bump on vulva, noticed a few days ago. After looking and feeling it is somewhat sore. Feels hard. No other symptoms.    Review of Systems  All other systems reviewed and are negative.   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 repeat ordered needs evenity   Parasomnia    Prediabetes    Rosacea    Vertebral fracture, osteoporotic (HCC)    while on trampoline   Vertigo    Vitamin D  deficiency       Objective:  Today's Vitals   11/03/23 1529  BP: 116/68  Pulse: 75  SpO2: 99%  Weight: 117 lb 3.2 oz (53.2 kg)   Body mass index is 20.44 kg/m.   Physical Exam Vitals and nursing note reviewed. Exam conducted with a chaperone present.  Constitutional:      Appearance: Normal appearance. She is well-developed.  Pulmonary:     Effort: Pulmonary effort is normal.  Abdominal:     General: Abdomen is flat.     Palpations: Abdomen is soft.  Genitourinary:    General: Normal vulva.     Vagina: No vaginal discharge, erythema, bleeding or lesions.     Uterus: Normal.      Adnexa: Right adnexa normal and left adnexa normal.      Comments: 2mm angiokeratoma on right labia majora Neurological:     Mental Status: She is alert.  Psychiatric:        Mood and Affect: Mood normal.        Thought Content: Thought content normal.        Judgment: Judgment normal.     Darice Hoit, CMA present for exam  Assessment:/Plan:  1. Angiokeratoma of vulva (Primary) Reassured no need for removal   Charbel Los B, NP 3:41 PM

## 2023-12-25 DIAGNOSIS — L570 Actinic keratosis: Secondary | ICD-10-CM | POA: Diagnosis not present

## 2023-12-25 DIAGNOSIS — L820 Inflamed seborrheic keratosis: Secondary | ICD-10-CM | POA: Diagnosis not present

## 2023-12-25 DIAGNOSIS — D225 Melanocytic nevi of trunk: Secondary | ICD-10-CM | POA: Diagnosis not present

## 2023-12-25 DIAGNOSIS — L578 Other skin changes due to chronic exposure to nonionizing radiation: Secondary | ICD-10-CM | POA: Diagnosis not present

## 2023-12-25 DIAGNOSIS — L814 Other melanin hyperpigmentation: Secondary | ICD-10-CM | POA: Diagnosis not present

## 2023-12-25 DIAGNOSIS — D2271 Melanocytic nevi of right lower limb, including hip: Secondary | ICD-10-CM | POA: Diagnosis not present

## 2024-01-08 ENCOUNTER — Other Ambulatory Visit: Payer: Self-pay | Admitting: General Practice

## 2024-01-08 DIAGNOSIS — R9389 Abnormal findings on diagnostic imaging of other specified body structures: Secondary | ICD-10-CM

## 2024-01-08 DIAGNOSIS — Z803 Family history of malignant neoplasm of breast: Secondary | ICD-10-CM

## 2024-02-23 DIAGNOSIS — R928 Other abnormal and inconclusive findings on diagnostic imaging of breast: Secondary | ICD-10-CM | POA: Diagnosis not present

## 2024-02-23 DIAGNOSIS — N644 Mastodynia: Secondary | ICD-10-CM | POA: Diagnosis not present

## 2024-02-23 DIAGNOSIS — N6011 Diffuse cystic mastopathy of right breast: Secondary | ICD-10-CM | POA: Diagnosis not present

## 2024-02-24 ENCOUNTER — Encounter: Payer: Self-pay | Admitting: Obstetrics and Gynecology

## 2024-02-24 ENCOUNTER — Ambulatory Visit (INDEPENDENT_AMBULATORY_CARE_PROVIDER_SITE_OTHER): Admitting: Obstetrics and Gynecology

## 2024-02-24 VITALS — BP 100/67 | HR 80 | Ht 64.0 in | Wt 118.6 lb

## 2024-02-24 DIAGNOSIS — N3281 Overactive bladder: Secondary | ICD-10-CM

## 2024-02-24 LAB — POCT URINALYSIS DIP (CLINITEK)
Bilirubin, UA: NEGATIVE
Blood, UA: NEGATIVE
Glucose, UA: NEGATIVE mg/dL
Leukocytes, UA: NEGATIVE
Nitrite, UA: NEGATIVE
POC PROTEIN,UA: NEGATIVE
Spec Grav, UA: 1.025 (ref 1.010–1.025)
Urobilinogen, UA: 0.2 U/dL
pH, UA: 6.5 (ref 5.0–8.0)

## 2024-02-24 MED ORDER — MIRABEGRON ER 25 MG PO TB24: 25.0000 mg | ORAL_TABLET | Freq: Every day | ORAL | 5 refills | Status: DC

## 2024-02-24 NOTE — Patient Instructions (Addendum)
Today we talked about ways to manage bladder urgency such as altering your diet to avoid irritative beverages and foods (bladder diet) as well as attempting to decrease stress and other exacerbating factors.    The Most Bothersome Foods* The Least Bothersome Foods*  Coffee - Regular & Decaf Tea - caffeinated Carbonated beverages - cola, non-colas, diet & caffeine-free Alcohols - Beer, Red Wine, White Wine, Champagne Fruits - Grapefruit, Lemon, Orange, Pineapple Fruit Juices - Cranberry, Grapefruit, Orange, Pineapple Vegetables - Tomato & Tomato Products Flavor Enhancers - Hot peppers, Spicy foods, Chili, Horseradish, Vinegar, Monosodium glutamate (MSG) Artificial Sweeteners - NutraSweet, Sweet 'N Low, Equal (sweetener), Saccharin Ethnic foods - Mexican, Thai, Indian food Water Milk - low-fat & whole Fruits - Bananas, Blueberries, Honeydew melon, Pears, Raisins, Watermelon Vegetables - Broccoli, Brussels Sprouts, Cabbage, Carrots, Cauliflower, Celery, Cucumber, Mushrooms, Peas, Radishes, Squash, Zucchini, White potatoes, Sweet potatoes & yams Poultry - Chicken, Eggs, Turkey, Meat - Beef, Pork, Lamb Seafood - Shrimp, Tuna fish, Salmon Grains - Oat, Rice Snacks - Pretzels, Popcorn  *Friedlander J. et al. Diet and its role in interstitial cystitis/bladder pain syndrome (IC/BPS) and comorbid conditions. [employer]. [employer]JU Int. 2012 Jan 11.   We discussed the symptoms of overactive bladder (OAB), which include urinary urgency, urinary frequency, night-time urination, with or without urge incontinence.  We discussed management including behavioral therapy (decreasing bladder irritants by following a bladder diet, urge suppression strategies, timed voids, bladder retraining), physical therapy, medication; and for refractory cases posterior tibial nerve stimulation, sacral neuromodulation, and intravesical botulinum toxin injection.   

## 2024-02-24 NOTE — Assessment & Plan Note (Signed)
-   We discussed the symptoms of overactive bladder (OAB), which include urinary urgency, urinary frequency, nocturia, with or without urge incontinence.  While we do not know the exact etiology of OAB, several treatment options exist. We discussed management including behavioral therapy (decreasing bladder irritants, urge suppression strategies, timed voids, bladder retraining), physical therapy, medication; for refractory cases posterior tibial nerve stimulation, sacral neuromodulation, and intravesical botulinum toxin injection.  - Recommended increasing water intake  - Prescribed Myrbetriq 25mg . For Beta-3 agonist medication, we discussed the potential side effect of elevated blood pressure which is more likely to occur in individuals with uncontrolled hypertension. - She is also interested in pelvic PT, referral placed.  - She is having some sensation of weak stream, although normal PVR today. If symptoms are refractory to treatment, should consider urodynamic testing to evaluate bladder function further

## 2024-02-24 NOTE — Progress Notes (Signed)
 New Patient Evaluation and Consultation  Referring Provider: Chrystal Lamarr RAMAN, * PCP: Chrystal Lamarr RAMAN, MD Date of Service: 02/24/2024  SUBJECTIVE Chief Complaint: New Patient (Initial Visit) Sarah Mclean is a 63 y.o. female is here for OAB.)  History of Present Illness: Sarah Mclean is a 63 y.o. White or Caucasian female seen in consultation at the request of Dr Vernice Salt for evaluation of sensation of incomplete bladder emptying and urinary frequency.     Urinary Symptoms: Does not leak urine.   Day time voids- every 2 hours (feels like she has to go more often but able to hold it).  Nocturia: 3-7 times per night to void. Voiding dysfunction:  does not empty bladder well.  Patient does not use a catheter to empty bladder.  When urinating, patient feels a weak stream and the need to urinate multiple times in a row Noticed her stream diminish about January this year.  Drinks: 30oz water per day, occasional green or black tea or Dr Nunzio. Occasional alcohol. Has a small amount of water before bed.  Does not have sleep apnea.   UTIs: 1 UTI's in the last year.   Denies history of blood in urine and kidney or bladder stones  Pelvic Organ Prolapse Symptoms:                  Patient Denies a feeling of a bulge the vaginal area.  Bowel Symptom: Bowel movements: 1-3 time(s) per day Stool consistency: soft  Straining: no.  Splinting: no.  Incomplete evacuation: no.  Patient Denies accidental bowel leakage / fecal incontinence Bowel regimen: none HM Colonoscopy          Upcoming     Colonoscopy (Every 10 Years) Next due on 08/05/2032    08/06/2022  Outside Claim: PR COLONOSCOPY FLX DX W/COLLJ SPEC WHEN PFRMD   Only the first 1 history entries have been loaded, but more history exists.                Sexual Function Sexually active: yes.  Sexual orientation: heterosexual Pain with sex: No Has very little hormone replacement  Pelvic Pain Denies  pelvic pain   Past Medical History:  Past Medical History:  Diagnosis Date   Dilation of aorta    Normal by chest CTA 03-2021   Hypercholesterolemia    Menopausal state    age 64 onset   Osteopenia    -2.8 tried HRT x 2years. repeat ordered needs evenity   Parasomnia    Rosacea    Vertebral fracture, osteoporotic (HCC)    while on trampoline   Vertigo    Vitamin D  deficiency      Past Surgical History:   Past Surgical History:  Procedure Laterality Date   CESAREAN SECTION     DILATION AND CURETTAGE OF UTERUS  2004   suction curettage   HEMORRHOID SURGERY     KNEE ARTHROSCOPY     RHINOPLASTY     TONSILLECTOMY       Past OB/GYN History: OB History  Gravida Para Term Preterm AB Living  6 2 2  4 2   SAB IAB Ectopic Multiple Live Births  4    2    # Outcome Date GA Lbr Len/2nd Weight Sex Type Anes PTL Lv  6 SAB           5 SAB           4 SAB  3 SAB           2 Term           1 Term             Obstetric Comments  2 bio Children   1 adopted child    Vaginal deliveries: 1,  Forceps/ Vacuum deliveries: 0, Cesarean section: 1 Menopausal: Denies vaginal bleeding since menopause     Component Value Date/Time   DIAGPAP  07/20/2023 1517    - Negative for intraepithelial lesion or malignancy (NILM)   DIAGPAP  03/28/2021 1524    - Negative for intraepithelial lesion or malignancy (NILM)   ADEQPAP  07/20/2023 1517    Satisfactory for evaluation; transformation zone component PRESENT.   ADEQPAP  03/28/2021 1524    Satisfactory for evaluation; transformation zone component PRESENT.    Medications: Patient has a current medication list which includes the following prescription(s): evening primrose oil, mirabegron er, multiple vitamins-minerals, np thyroid , progesterone , pyridoxine, UNABLE TO FIND, and valacyclovir.   Allergies: Patient is allergic to hydrocodone-acetaminophen, latex, vicodin [hydrocodone-acetaminophen], erythromycin, and penicillins.    Social History:  Social History   Tobacco Use   Smoking status: Never   Smokeless tobacco: Never  Vaping Use   Vaping status: Never Used  Substance Use Topics   Alcohol use: Yes    Comment: occ   Drug use: No    Relationship status: married Patient lives with her husband.   Patient is not employed. Regular exercise: Yes: walking 1-2 times a week, workout regularly History of abuse: No  Family History:   Family History  Problem Relation Age of Onset   Cancer Mother        sarcoma   Colon cancer Sister    Cancer Sister        colon   Breast cancer Sister      Review of Systems: Review of Systems  Constitutional:  Negative for fever, malaise/fatigue and weight loss.  Respiratory:  Negative for cough, shortness of breath and wheezing.   Cardiovascular:  Negative for chest pain, palpitations and leg swelling.  Gastrointestinal:  Negative for abdominal pain and blood in stool.  Genitourinary:  Negative for dysuria.  Musculoskeletal:  Negative for myalgias.  Skin:  Negative for rash.  Neurological:  Negative for dizziness and headaches.  Endo/Heme/Allergies:  Bruises/bleeds easily.  Psychiatric/Behavioral:  Negative for depression. The patient is not nervous/anxious.      OBJECTIVE Physical Exam: Vitals:   02/24/24 1104  BP: 100/67  Pulse: 80  Weight: 118 lb 9.6 oz (53.8 kg)  Height: 5' 4 (1.626 m)    Physical Exam Vitals reviewed. Exam conducted with a chaperone present.  Constitutional:      General: She is not in acute distress. Pulmonary:     Effort: Pulmonary effort is normal.  Abdominal:     General: There is no distension.     Palpations: Abdomen is soft.     Tenderness: There is no abdominal tenderness. There is no rebound.  Musculoskeletal:        General: No swelling. Normal range of motion.  Skin:    General: Skin is warm and dry.     Findings: No rash.  Neurological:     Mental Status: She is alert and oriented to person, place, and  time.  Psychiatric:        Mood and Affect: Mood normal.        Behavior: Behavior normal.      GU /  Detailed Urogynecologic Evaluation:  Pelvic Exam: Normal external female genitalia; Bartholin's and Skene's glands normal in appearance; urethral meatus normal in appearance, no urethral masses or discharge.   CST: negative  Speculum exam reveals normal vaginal mucosa without atrophy. Cervix normal appearance. Uterus normal single, nontender. Adnexa no mass, fullness, tenderness.     Pelvic floor strength II/V  Pelvic floor musculature: Right levator non-tender, Right obturator non-tender, Left levator non-tender, Left obturator non-tender  POP-Q:   POP-Q  -3                                            Aa   -3                                           Ba  -8.5                                              C   2.5                                            Gh  5                                            Pb  10                                            tvl   -2.5                                            Ap  -2.5                                            Bp  -10                                              D      Rectal Exam:  deferred  Post-Void Residual (PVR) by Bladder Scan: In order to evaluate bladder emptying, we discussed obtaining a postvoid residual and patient agreed to this procedure.  Procedure: The ultrasound unit was placed on the patient's abdomen in the suprapubic region after the patient had voided.    Post Void Residual - 02/24/24 1112       Post Void Residual   Post Void Residual 24 mL           Laboratory Results: Lab Results  Component Value Date  COLORU yellow 02/24/2024   CLARITYU clear 02/24/2024   GLUCOSEUR negative 02/24/2024   BILIRUBINUR negative 02/24/2024   SPECGRAV 1.025 02/24/2024   RBCUR negative 02/24/2024   PHUR 6.5 02/24/2024   PROTEINUR NEGATIVE 12/12/2015   UROBILINOGEN 0.2 02/24/2024   LEUKOCYTESUR  Negative 02/24/2024    Lab Results  Component Value Date   CREATININE 0.75 07/23/2015   CREATININE 0.72 07/22/2013   CREATININE 0.71 06/14/2012    Lab Results  Component Value Date   HGBA1C 5.8 (H) 07/23/2015    Lab Results  Component Value Date   HGB 12.9 07/23/2015     ASSESSMENT AND PLAN Ms. Chavero is a 63 y.o. with:  1. Overactive bladder     Overactive bladder Assessment & Plan: - We discussed the symptoms of overactive bladder (OAB), which include urinary urgency, urinary frequency, nocturia, with or without urge incontinence.  While we do not know the exact etiology of OAB, several treatment options exist. We discussed management including behavioral therapy (decreasing bladder irritants, urge suppression strategies, timed voids, bladder retraining), physical therapy, medication; for refractory cases posterior tibial nerve stimulation, sacral neuromodulation, and intravesical botulinum toxin injection.  - Recommended increasing water intake  - Prescribed Myrbetriq 25mg . For Beta-3 agonist medication, we discussed the potential side effect of elevated blood pressure which is more likely to occur in individuals with uncontrolled hypertension. - She is also interested in pelvic PT, referral placed.  - She is having some sensation of weak stream, although normal PVR today. If symptoms are refractory to treatment, should consider urodynamic testing to evaluate bladder function further  Orders: -     Mirabegron ER; Take 1 tablet (25 mg total) by mouth daily.  Dispense: 30 tablet; Refill: 5 -     AMB referral to rehabilitation -     POCT URINALYSIS DIP (CLINITEK)   Return 6 weeks  Rosaline LOISE Caper, MD

## 2024-02-25 MED ORDER — TROSPIUM CHLORIDE ER 60 MG PO CP24: 1.0000 | ORAL_CAPSULE | Freq: Every day | ORAL | 5 refills | Status: AC

## 2024-04-05 ENCOUNTER — Encounter: Payer: Self-pay | Admitting: Obstetrics and Gynecology

## 2024-04-05 ENCOUNTER — Telehealth: Admitting: Obstetrics and Gynecology

## 2024-04-05 DIAGNOSIS — N3281 Overactive bladder: Secondary | ICD-10-CM

## 2024-04-05 NOTE — Progress Notes (Signed)
 Virtual Visit via Video Note  I connected with Sarah Mclean on 04/05/24 at  9:20 AM EST by a video enabled telemedicine application and verified that I am speaking with the correct person using two identifiers.   Location: Patient: [name] Provider: In office with door shut   I discussed the limitations of evaluation and management by telemedicine and the availability of in person appointments. The patient expressed understanding and agreed to proceed.  History of Present Illness:  Patient has been dealing with OAB symptoms including nocturia and urinary frequency. She reports she has been taking Trospium  60mg  ER daily and has seen a difference in her OAB symptoms. She reports the last few days have had up to a 90% improvement.   Patient states she has been dealing with dry mouth since starting the medication, but that the Myrbetriq  was estimated to be much more expensive.    Observations/Objective:  Patient is alert and well oriented. She is well groomed and responding to questions appropriately.    Assessment and Plan:  Patient is set to start PT at [facility] Dec. 31. We discussed if she would prefer we can send a referral to [facility] PT to see if she can get in sooner. She is considering this option.   Patient will continue on Trospium  60mg  ER daily for now. We can consider changing the medication if she would like after the first of the year due to side effects. She is suffering with dry mouth significantly. We discussed the Beta3 Agonists and how this may work differently. Her ultimate goal is to be off medication entirely. We discussed that, after some pelvic floor PT, she may be able to discontinue medications.    Follow Up Instructions:  Patient to continue Trospium  60mg  ER at this time. Will re-eval in a few weeks if she would like to make changes. Patient to follow up after some Physical therapy.     I discussed the assessment and treatment plan  with the patient. The patient was provided an opportunity to ask questions and all were answered. The patient agreed with the plan and demonstrated an understanding of the instructions.   The patient was advised to call back or seek an in-person evaluation if the symptoms worsen or if the condition fails to improve as anticipated.  I provided 12 minutes of non-face-to-face time during this encounter.   Rylann Munford G Kayn Haymore, NP

## 2024-04-06 ENCOUNTER — Ambulatory Visit: Admitting: Obstetrics and Gynecology

## 2024-04-06 ENCOUNTER — Encounter: Payer: Self-pay | Admitting: Obstetrics and Gynecology

## 2024-04-06 DIAGNOSIS — Z0000 Encounter for general adult medical examination without abnormal findings: Secondary | ICD-10-CM | POA: Diagnosis not present

## 2024-04-06 DIAGNOSIS — N3281 Overactive bladder: Secondary | ICD-10-CM

## 2024-04-06 NOTE — Telephone Encounter (Signed)
 PT referral has been faxed

## 2024-04-08 DIAGNOSIS — E7800 Pure hypercholesterolemia, unspecified: Secondary | ICD-10-CM | POA: Diagnosis not present

## 2024-04-08 DIAGNOSIS — Z79899 Other long term (current) drug therapy: Secondary | ICD-10-CM | POA: Diagnosis not present

## 2024-04-08 DIAGNOSIS — E039 Hypothyroidism, unspecified: Secondary | ICD-10-CM | POA: Diagnosis not present

## 2024-04-13 DIAGNOSIS — N3281 Overactive bladder: Secondary | ICD-10-CM | POA: Diagnosis not present

## 2024-04-13 DIAGNOSIS — R3915 Urgency of urination: Secondary | ICD-10-CM | POA: Diagnosis not present

## 2024-04-13 DIAGNOSIS — R3916 Straining to void: Secondary | ICD-10-CM | POA: Diagnosis not present

## 2024-04-13 DIAGNOSIS — R3912 Poor urinary stream: Secondary | ICD-10-CM | POA: Diagnosis not present

## 2024-04-27 ENCOUNTER — Ambulatory Visit: Admitting: Physical Therapy

## 2024-05-09 ENCOUNTER — Encounter: Payer: Self-pay | Admitting: *Deleted

## 2024-07-20 ENCOUNTER — Ambulatory Visit: Admitting: Obstetrics and Gynecology
